# Patient Record
Sex: Female | Born: 1970 | Race: White | Hispanic: No | Marital: Single | Smoking: Never smoker
Health system: Southern US, Community
[De-identification: ages and names within clinical notes are randomized; demographics above are authoritative.]

## PROBLEM LIST (undated history)

## (undated) DIAGNOSIS — R053 Chronic cough: Secondary | ICD-10-CM

---

## 1998-01-08 ENCOUNTER — Emergency Department (HOSPITAL_COMMUNITY): Admission: EM | Admit: 1998-01-08 | Discharge: 1998-01-08 | Payer: Self-pay | Admitting: Emergency Medicine

## 2003-04-13 ENCOUNTER — Encounter: Admission: RE | Admit: 2003-04-13 | Discharge: 2003-04-13 | Payer: Self-pay | Admitting: Family Medicine

## 2010-02-26 ENCOUNTER — Encounter: Payer: Self-pay | Admitting: Family Medicine

## 2020-05-01 ENCOUNTER — Emergency Department (HOSPITAL_COMMUNITY): Payer: Self-pay

## 2020-05-01 ENCOUNTER — Encounter (HOSPITAL_COMMUNITY): Payer: Self-pay | Admitting: Emergency Medicine

## 2020-05-01 ENCOUNTER — Inpatient Hospital Stay (HOSPITAL_COMMUNITY)
Admission: EM | Admit: 2020-05-01 | Discharge: 2020-05-04 | DRG: 854 | Disposition: A | Discharge status: Home / Self Care | Payer: Self-pay | Attending: Family Medicine | Admitting: Family Medicine

## 2020-05-01 DIAGNOSIS — Z91012 Allergy to eggs: Secondary | ICD-10-CM

## 2020-05-01 DIAGNOSIS — Z20822 Contact with and (suspected) exposure to covid-19: Secondary | ICD-10-CM | POA: Diagnosis present

## 2020-05-01 DIAGNOSIS — Z91018 Allergy to other foods: Secondary | ICD-10-CM

## 2020-05-01 DIAGNOSIS — E876 Hypokalemia: Secondary | ICD-10-CM | POA: Diagnosis present

## 2020-05-01 DIAGNOSIS — E872 Acidosis, unspecified: Secondary | ICD-10-CM | POA: Diagnosis present

## 2020-05-01 DIAGNOSIS — N289 Disorder of kidney and ureter, unspecified: Secondary | ICD-10-CM

## 2020-05-01 DIAGNOSIS — Z833 Family history of diabetes mellitus: Secondary | ICD-10-CM

## 2020-05-01 DIAGNOSIS — Z23 Encounter for immunization: Secondary | ICD-10-CM

## 2020-05-01 DIAGNOSIS — L02212 Cutaneous abscess of back [any part, except buttock]: Secondary | ICD-10-CM | POA: Diagnosis present

## 2020-05-01 DIAGNOSIS — A419 Sepsis, unspecified organism: Principal | ICD-10-CM | POA: Diagnosis present

## 2020-05-01 DIAGNOSIS — L0291 Cutaneous abscess, unspecified: Secondary | ICD-10-CM

## 2020-05-01 DIAGNOSIS — I96 Gangrene, not elsewhere classified: Secondary | ICD-10-CM | POA: Diagnosis present

## 2020-05-01 DIAGNOSIS — B9562 Methicillin resistant Staphylococcus aureus infection as the cause of diseases classified elsewhere: Secondary | ICD-10-CM | POA: Diagnosis present

## 2020-05-01 DIAGNOSIS — R238 Other skin changes: Secondary | ICD-10-CM | POA: Diagnosis present

## 2020-05-01 HISTORY — DX: Chronic cough: R05.3

## 2020-05-01 LAB — URINALYSIS, ROUTINE W REFLEX MICROSCOPIC
Bilirubin Urine: NEGATIVE
Glucose, UA: NEGATIVE mg/dL
Hgb urine dipstick: NEGATIVE
Ketones, ur: NEGATIVE mg/dL
Nitrite: NEGATIVE
Protein, ur: NEGATIVE mg/dL
Specific Gravity, Urine: 1.018 (ref 1.005–1.030)
pH: 5 (ref 5.0–8.0)

## 2020-05-01 LAB — CBC WITH DIFFERENTIAL/PLATELET
Abs Immature Granulocytes: 0.2 10*3/uL — ABNORMAL HIGH (ref 0.00–0.07)
Basophils Absolute: 0 10*3/uL (ref 0.0–0.1)
Basophils Relative: 0 %
Eosinophils Absolute: 0.4 10*3/uL (ref 0.0–0.5)
Eosinophils Relative: 2 %
HCT: 37.4 % (ref 36.0–46.0)
Hemoglobin: 12.2 g/dL (ref 12.0–15.0)
Immature Granulocytes: 1 %
Lymphocytes Relative: 13 %
Lymphs Abs: 2.7 10*3/uL (ref 0.7–4.0)
MCH: 30.8 pg (ref 26.0–34.0)
MCHC: 32.6 g/dL (ref 30.0–36.0)
MCV: 94.4 fL (ref 80.0–100.0)
Monocytes Absolute: 0.9 10*3/uL (ref 0.1–1.0)
Monocytes Relative: 4 %
Neutro Abs: 16.8 10*3/uL — ABNORMAL HIGH (ref 1.7–7.7)
Neutrophils Relative %: 80 %
Platelets: 479 10*3/uL — ABNORMAL HIGH (ref 150–400)
RBC: 3.96 MIL/uL (ref 3.87–5.11)
RDW: 12.5 % (ref 11.5–15.5)
WBC: 21 10*3/uL — ABNORMAL HIGH (ref 4.0–10.5)
nRBC: 0 % (ref 0.0–0.2)

## 2020-05-01 LAB — COMPREHENSIVE METABOLIC PANEL
ALT: 41 U/L (ref 0–44)
AST: 30 U/L (ref 15–41)
Albumin: 2.7 g/dL — ABNORMAL LOW (ref 3.5–5.0)
Alkaline Phosphatase: 145 U/L — ABNORMAL HIGH (ref 38–126)
Anion gap: 11 (ref 5–15)
BUN: 10 mg/dL (ref 6–20)
CO2: 26 mmol/L (ref 22–32)
Calcium: 8 mg/dL — ABNORMAL LOW (ref 8.9–10.3)
Chloride: 101 mmol/L (ref 98–111)
Creatinine, Ser: 0.83 mg/dL (ref 0.44–1.00)
GFR, Estimated: >60 mL/min (ref >60)
Glucose, Bld: 149 mg/dL — ABNORMAL HIGH (ref 70–99)
Potassium: 3.2 mmol/L — ABNORMAL LOW (ref 3.5–5.1)
Sodium: 138 mmol/L (ref 135–145)
Total Bilirubin: 0.6 mg/dL (ref 0.3–1.2)
Total Protein: 6.1 g/dL — ABNORMAL LOW (ref 6.5–8.1)

## 2020-05-01 LAB — LACTIC ACID, PLASMA: Lactic Acid, Venous: 2 mmol/L (ref 0.5–1.9)

## 2020-05-01 LAB — I-STAT BETA HCG BLOOD, ED (MC, WL, AP ONLY): I-stat hCG, quantitative: <5 m[IU]/mL (ref <5)

## 2020-05-01 MED ORDER — PIPERACILLIN-TAZOBACTAM 3.375 G IVPB 30 MIN
3.3750 g | Freq: Once | INTRAVENOUS | Status: AC
  Administered 2020-05-02: 3.375 g via INTRAVENOUS
  Filled 2020-05-01: qty 50

## 2020-05-01 MED ORDER — LACTATED RINGERS IV BOLUS (SEPSIS)
1000.0000 mL | Freq: Once | INTRAVENOUS | Status: AC
  Administered 2020-05-02: 1000 mL via INTRAVENOUS

## 2020-05-01 MED ORDER — FENTANYL CITRATE (PF) 100 MCG/2ML IJ SOLN
50.0000 ug | Freq: Once | INTRAMUSCULAR | Status: AC
  Administered 2020-05-02: 50 ug via INTRAVENOUS
  Filled 2020-05-01: qty 2

## 2020-05-01 MED ORDER — LACTATED RINGERS IV SOLN: INTRAVENOUS | Status: DC

## 2020-05-01 MED ORDER — VANCOMYCIN HCL IN DEXTROSE 1-5 GM/200ML-% IV SOLN
1000.0000 mg | Freq: Once | INTRAVENOUS | Status: AC
  Administered 2020-05-02: 1000 mg via INTRAVENOUS
  Filled 2020-05-01 (×2): qty 200

## 2020-05-01 MED ORDER — ACETAMINOPHEN 325 MG PO TABS
650.0000 mg | ORAL_TABLET | Freq: Once | ORAL | Status: AC
  Administered 2020-05-02: 650 mg via ORAL
  Filled 2020-05-01: qty 2

## 2020-05-01 NOTE — ED Notes (Signed)
The pt has three complaints swollen lesion lt posterior chest brown crusted area she has had for over a week does not know what started it  Coughing for over a week and itching over her body

## 2020-05-01 NOTE — Sepsis Progress Note (Signed)
Following for Code Sepsis  

## 2020-05-01 NOTE — ED Provider Notes (Signed)
[place] EMERGENCY DEPARTMENT Provider Note   CSN: [id number] Arrival date & time: 05/01/20  1830     History Chief Complaint  Patient presents with  . Abscess  . Rash    Robin Padilla is a 50 y.o. female without significant past medical hx who presents to the ED with complaints of painful wound to right upper back x 1.5 weeks. Patient states that she has a painful wound to her right upper back, it has been constant, states it came out of nowhere and has been persistent, discomfort is worse when laying on her back or applying pressure to that area. No other alleviating/aggravating factors. Has had some drainage from the wound on her clothing. With this painful wound area she has also developed a rash primarily to her upper/lower extremities, some to the abdomen, which is pruritic, includes the palms of the hands. She does not note any new products, environments, or medications. Denies recent foreign travel, prior hx of TB, close contact w/ TB, or prior incarceration. She is not sexually active and does not have concern for STI. Denies IVDU. Denies tic bites. No sick contacts w/ similar sxs. Her father has noted that she had been very warm to the touch, no known fevers at home. Patient also mentions chronic cough/throat clearing problem. Denies chest pain, dyspnea, abdominal pain, N/V/D, leg swelling, or syncope.    HPI     History reviewed. No pertinent past medical history.  There are no problems to display for this patient.   History reviewed. No pertinent surgical history.   OB History   No obstetric history on file.     No family history on file.  Social History   Tobacco Use  . Smoking status: Never Smoker  . Smokeless tobacco: Never Used  Substance Use Topics  . Alcohol use: Not Currently  . Drug use: Not Currently    Home Medications Prior to Admission medications   Not on File    Allergies    Patient has no allergy information on  record.  Review of Systems   Review of Systems  Constitutional: Negative for fever.  HENT: Positive for congestion. Negative for ear pain and sore throat.   Respiratory: Positive for cough. Negative for shortness of breath.   Cardiovascular: Negative for chest pain.  Gastrointestinal: Negative for abdominal pain, diarrhea, nausea and vomiting.  Genitourinary: Negative for dysuria.  Musculoskeletal: Positive for back pain (to wound).  Skin: Positive for rash and wound.  Neurological: Negative for syncope.  All other systems reviewed and are negative.   Physical Exam Updated Vital Signs BP 138/84 (BP Location: Left Arm)   Pulse (!) 124   Temp 99.8 F (37.7 C) (Oral)   Resp 16   SpO2 100%   Physical Exam Vitals and nursing note reviewed.  Constitutional:      General: She is not in acute distress.    Appearance: She is well-developed. She is not toxic-appearing.  HENT:     Head: Normocephalic and atraumatic.  Eyes:     General:        Right eye: No discharge.        Left eye: No discharge.     Conjunctiva/sclera: Conjunctivae normal.  Cardiovascular:     Rate and Rhythm: Regular rhythm. Tachycardia present.  Pulmonary:     Effort: Pulmonary effort is normal. No respiratory distress.     Breath sounds: Normal breath sounds. No wheezing, rhonchi or rales.  Abdominal:  General: There is no distension.     Palpations: Abdomen is soft.     Tenderness: There is no abdominal tenderness. There is no guarding or rebound.  Musculoskeletal:     Cervical back: Neck supple.  Skin:    General: Skin is warm and dry.     Findings: Erythema and rash (rash primarily to the lower extremities and some to the upper extremities bilaterally, mild areas somewhat noted on the palms) present. Rash is macular and papular.       Neurological:     Mental Status: She is alert.     Comments: Clear speech.   Psychiatric:        Behavior: Behavior normal.                 ED  Results / Procedures / Treatments   Labs (all labs ordered are listed, but only abnormal results are displayed) Labs Reviewed  LACTIC ACID, PLASMA - Abnormal; Notable for the following components:      Result Value   Lactic Acid, Venous 2.0 (*)    All other components within normal limits  COMPREHENSIVE METABOLIC PANEL - Abnormal; Notable for the following components:   Potassium 3.2 (*)    Glucose, Bld 149 (*)    Calcium 8.0 (*)    Total Protein 6.1 (*)    Albumin 2.7 (*)    Alkaline Phosphatase 145 (*)    All other components within normal limits  CBC WITH DIFFERENTIAL/PLATELET - Abnormal; Notable for the following components:   WBC 21.0 (*)    Platelets 479 (*)    Neutro Abs 16.8 (*)    Abs Immature Granulocytes 0.20 (*)    All other components within normal limits  LACTIC ACID, PLASMA  URINALYSIS, ROUTINE W REFLEX MICROSCOPIC  I-STAT BETA HCG BLOOD, ED ([facility], [facility], [facility] ONLY)    EKG None  Radiology CT Chest W Contrast  Result Date: 05/02/2020 CLINICAL DATA:  Left posterior chest wall mass EXAM: CT CHEST WITH CONTRAST TECHNIQUE: Multidetector CT imaging of the chest was performed during intravenous contrast administration. CONTRAST:  22m OMNIPAQUE IOHEXOL 300 MG/ML  SOLN COMPARISON:  Abdominal ultrasound April 13, 2003. FINDINGS: Cardiovascular: No significant vascular findings. Normal heart size. No pericardial effusion. Mediastinum/Nodes: No enlarged mediastinal, hilar, or axillary lymph nodes. Thyroid gland, trachea, and esophagus demonstrate no significant findings. Lungs/Pleura: Lungs are clear. No pleural effusion or pneumothorax. Upper Abdomen: Multiple hypodense left upper pole renal lesions some of which are only partially visualized largest of which measures 2.3 cm on image 136/3 with density greater than that which would be expected for simple cyst. Musculoskeletal: Phlegmonous collection with adjacent inflammatory stranding and cutaneous skin thickening in the subcutaneous  soft tissues of the left posterior thorax measuring 5.7 x 2.6 x 7.4 cm on image 34/7 and 87/3. Consistently stool IMPRESSION: 1. Phlegmonous collection with adjacent inflammatory stranding and cutaneous skin thickening in the subcutaneous soft tissues of the left posterior thorax measuring 5.7 x 2.6 x 7.4 cm. Findings are most consistent with a soft tissue phlegmonous collection/developing abscess. 2. Multiple hypodense left upper pole renal lesions some of which are only partially visualized largest of which measures 2.3 cm with density greater than that would be expected for simple cyst. Further evaluation with nonemergent renal protocol abdominal MRI or CT with and without contrast in is recommended. Electronically Signed   By: JDahlia BailiffMD   On: 05/02/2020 01:05    Procedures .Critical Care E&M Performed by: PKennith Maes  R, PA-C  After initial E/M assessment, critical care services were subsequently performed that were exclusive of separately billable procedures or treatment.    Marland Kitchen.Incision and Drainage  Date/Time: 05/02/2020 2:44 AM Performed by: Amaryllis Dyke, PA-C Authorized by: Amaryllis Dyke, PA-C   Consent:    Consent obtained:  Verbal   Consent given by:  Patient   Risks, benefits, and alternatives were discussed: yes     Risks discussed:  Bleeding, damage to other organs, infection, incomplete drainage and pain   Alternatives discussed:  No treatment and alternative treatment Location:    Type:  Abscess   Size:  7.4   Location:  Trunk   Trunk location:  Back Pre-procedure details:    Skin preparation:  Povidone-iodine Anesthesia:    Anesthesia method:  Local infiltration   Local anesthetic:  Lidocaine 1% WITH epi Procedure type:    Complexity:  Complex Procedure details:    Needle aspiration: yes     Needle size:  18 G   Incision types:  Stab incision   Wound management:  Probed and deloculated and irrigated with saline   Drainage:   Bloody and purulent   Drainage amount:  Moderate   Wound treatment:  Wound left open   Packing materials:  1/4 in iodoform gauze Post-procedure details:    Procedure completion:  Tolerated well, no immediate complications  CRITICAL CARE Performed by: Kennith Maes   Total critical care time: 35 minutes  Critical care time was exclusive of separately billable procedures and treating other patients.  Critical care was necessary to treat or prevent imminent or life-threatening deterioration.  Critical care was time spent personally by me on the following activities: development of treatment plan with patient and/or surrogate as well as nursing, discussions with consultants, evaluation of patient's response to treatment, examination of patient, obtaining history from patient or surrogate, ordering and performing treatments and interventions, ordering and review of laboratory studies, ordering and review of radiographic studies, pulse oximetry and re-evaluation of patient's condition.    Medications Ordered in ED Medications  lactated ringers infusion (has no administration in time range)  lactated ringers bolus 1,000 mL (1,000 mLs Intravenous New Bag/Given 05/02/20 0217)  vancomycin (VANCOCIN) IVPB 1000 mg/200 mL premix (1,000 mg Intravenous New Bag/Given 05/02/20 0209)  piperacillin-tazobactam (ZOSYN) IVPB 3.375 g (has no administration in time range)  vancomycin (VANCOREADY) IVPB 1250 mg/250 mL (has no administration in time range)  acetaminophen (TYLENOL) tablet 650 mg (650 mg Oral Given 05/02/20 0235)  fentaNYL (SUBLIMAZE) injection 50 mcg (50 mcg Intravenous Given 05/02/20 0219)  piperacillin-tazobactam (ZOSYN) IVPB 3.375 g (0 g Intravenous Stopped 05/02/20 0049)  lactated ringers bolus 1,000 mL (0 mLs Intravenous Stopped 05/02/20 0222)  iohexol (OMNIPAQUE) 300 MG/ML solution 75 mL (75 mLs Intravenous Contrast Given 05/02/20 0047)  lidocaine-EPINEPHrine (XYLOCAINE W/EPI) 1 %-1:100000  (with pres) injection 20 mL (20 mLs Infiltration Given 05/02/20 0222)  Tdap (BOOSTRIX) injection 0.5 mL (0.5 mLs Intramuscular Given 05/02/20 0236)    ED Course  I have reviewed the triage vital signs and the nursing notes.  Pertinent labs & imaging results that were available during my care of the patient were reviewed by me and considered in my medical decision making (see chart for details).    MDM Rules/Calculators/A&P                         Patient presents to the ED with complaints of left upper back wound & rash.  Nontoxic, tachycardic, borderline oral temp of 99.   Additional history obtained:  Additional history obtained from chart review & nursing note review.   Lab Tests:  I reviewed and interpreted labs by triage, which included:  CBC: Leukocytosis @ 21 with left shift. Elevated platelets.  CMP: mild hypoalbuminemia & hypokalemia. Mild elevation in alk phos.  Lactic acid: mild elevation @ 2.0.   Following initial assessment code sepsis activated, vanc/zosyn ordered, 1L LR started, will also give tylenol/fentanyl for pain. Will further assess with additional laboratory testing & CT chest with contrast. Plan for admission. Findings & plan of care discussed w/ attending Dr. Langston Masker who has evaluated patient & is in agreement.   Additionally ordered, reviewed & interpreted additional laboratory testing:  PT/INR/APTT: WNL COVID/influenza: Negative HIV: Non-reactive RPR: Pending UDS: Pending  Imaging Studies ordered:  I ordered imaging studies which included CT chest w contrast, I independently reviewed, formal radiology impression shows:  1. Phlegmonous collection with adjacent inflammatory stranding and cutaneous skin thickening in the subcutaneous soft tissues of the left posterior thorax measuring 5.7 x 2.6 x 7.4 cm. Findings are most consistent with a soft tissue phlegmonous collection/developing abscess. 2. Multiple hypodense left upper pole renal lesions some of which are  only partially visualized largest of which measures 2.3 cm with density greater than that would be expected for simple cyst. Further evaluation with nonemergent renal protocol abdominal MRI or CT with and without contrast in is recommended.   ED Course:  01:33: CONSULT: Discussed with hospitalist Dr. Cyd Silence- accepts admission, discussed I&D, I discussed w/ supervising EDP Dr. Roxanne Mins @ change of shift who is in agreement with my proceeding with I&D in the ED.   I&D performed as documented above, patient tolerated well.  Given extent of abscess and appearance of tissue feel she would benefit from general surgery reassessment during admission which was relayed to admitting team.   Portions of this note were generated with Dragon dictation software. Dictation errors may occur despite best attempts at proofreading.  Final Clinical Impression(s) / ED Diagnoses Final diagnoses:  Abscess  Sepsis, due to unspecified organism, unspecified whether acute organ dysfunction present [facility])    Rx / DC Orders ED Discharge Orders    None       Amaryllis Dyke, PA-C 05/02/20 0252    Wyvonnia Dusky, MD 05/02/20 1043

## 2020-05-01 NOTE — Sepsis Progress Note (Signed)
Notified bedside nurse of need to order antibiotics   Antibiotics now ordered

## 2020-05-01 NOTE — ED Triage Notes (Addendum)
Large abscess to L upper back that pt reports has been there for 1 week.  Also reports rash all over x 5-6 days.  Pt reports cough that she states is "a respiratory thing I've had 3 years since walking in the water when my mom died."  Pt denies previous medical history.

## 2020-05-02 ENCOUNTER — Observation Stay (HOSPITAL_COMMUNITY): Payer: Self-pay | Admitting: Certified Registered"

## 2020-05-02 ENCOUNTER — Other Ambulatory Visit: Payer: Self-pay

## 2020-05-02 ENCOUNTER — Observation Stay (HOSPITAL_COMMUNITY): Payer: Self-pay

## 2020-05-02 ENCOUNTER — Emergency Department (HOSPITAL_COMMUNITY): Payer: Self-pay

## 2020-05-02 ENCOUNTER — Encounter (HOSPITAL_COMMUNITY)
Admission: EM | Disposition: A | Discharge status: Home / Self Care | Payer: Self-pay | Source: Home / Self Care | Attending: Family Medicine

## 2020-05-02 ENCOUNTER — Encounter (HOSPITAL_COMMUNITY): Payer: Self-pay | Admitting: Internal Medicine

## 2020-05-02 DIAGNOSIS — L02212 Cutaneous abscess of back [any part, except buttock]: Secondary | ICD-10-CM

## 2020-05-02 DIAGNOSIS — E876 Hypokalemia: Secondary | ICD-10-CM | POA: Diagnosis present

## 2020-05-02 DIAGNOSIS — E872 Acidosis, unspecified: Secondary | ICD-10-CM | POA: Diagnosis present

## 2020-05-02 DIAGNOSIS — N289 Disorder of kidney and ureter, unspecified: Secondary | ICD-10-CM

## 2020-05-02 DIAGNOSIS — A419 Sepsis, unspecified organism: Secondary | ICD-10-CM | POA: Diagnosis present

## 2020-05-02 DIAGNOSIS — L0291 Cutaneous abscess, unspecified: Secondary | ICD-10-CM

## 2020-05-02 HISTORY — PX: OTHER SURGICAL HISTORY: SHX169

## 2020-05-02 HISTORY — PX: INCISION AND DRAINAGE OF WOUND: SHX1803

## 2020-05-02 HISTORY — DX: Cutaneous abscess, unspecified: L02.91

## 2020-05-02 LAB — COMPREHENSIVE METABOLIC PANEL
ALT: 30 U/L (ref 0–44)
AST: 22 U/L (ref 15–41)
Albumin: 2.2 g/dL — ABNORMAL LOW (ref 3.5–5.0)
Alkaline Phosphatase: 103 U/L (ref 38–126)
Anion gap: 7 (ref 5–15)
BUN: 5 mg/dL — ABNORMAL LOW (ref 6–20)
CO2: 29 mmol/L (ref 22–32)
Calcium: 7.9 mg/dL — ABNORMAL LOW (ref 8.9–10.3)
Chloride: 103 mmol/L (ref 98–111)
Creatinine, Ser: 0.7 mg/dL (ref 0.44–1.00)
GFR, Estimated: >60 mL/min (ref >60)
Glucose, Bld: 137 mg/dL — ABNORMAL HIGH (ref 70–99)
Potassium: 3.4 mmol/L — ABNORMAL LOW (ref 3.5–5.1)
Sodium: 139 mmol/L (ref 135–145)
Total Bilirubin: 1 mg/dL (ref 0.3–1.2)
Total Protein: 4.8 g/dL — ABNORMAL LOW (ref 6.5–8.1)

## 2020-05-02 LAB — POCT I-STAT EG7
Acid-Base Excess: 6 mmol/L — ABNORMAL HIGH (ref 0.0–2.0)
Bicarbonate: 31 mmol/L — ABNORMAL HIGH (ref 20.0–28.0)
Calcium, Ion: 1.13 mmol/L — ABNORMAL LOW (ref 1.15–1.40)
HCT: 31 % — ABNORMAL LOW (ref 36.0–46.0)
Hemoglobin: 10.5 g/dL — ABNORMAL LOW (ref 12.0–15.0)
O2 Saturation: 85 %
Potassium: 3.1 mmol/L — ABNORMAL LOW (ref 3.5–5.1)
Sodium: 139 mmol/L (ref 135–145)
TCO2: 32 mmol/L (ref 22–32)
pCO2, Ven: 48 mmHg (ref 44.0–60.0)
pH, Ven: 7.418 (ref 7.250–7.430)
pO2, Ven: 50 mmHg — ABNORMAL HIGH (ref 32.0–45.0)

## 2020-05-02 LAB — CBC WITH DIFFERENTIAL/PLATELET
Abs Immature Granulocytes: 0.17 10*3/uL — ABNORMAL HIGH (ref 0.00–0.07)
Basophils Absolute: 0 10*3/uL (ref 0.0–0.1)
Basophils Relative: 0 %
Eosinophils Absolute: 0.3 10*3/uL (ref 0.0–0.5)
Eosinophils Relative: 1 %
HCT: 32.9 % — ABNORMAL LOW (ref 36.0–46.0)
Hemoglobin: 10.5 g/dL — ABNORMAL LOW (ref 12.0–15.0)
Immature Granulocytes: 1 %
Lymphocytes Relative: 7 %
Lymphs Abs: 1.7 10*3/uL (ref 0.7–4.0)
MCH: 31 pg (ref 26.0–34.0)
MCHC: 31.9 g/dL (ref 30.0–36.0)
MCV: 97.1 fL (ref 80.0–100.0)
Monocytes Absolute: 1 10*3/uL (ref 0.1–1.0)
Monocytes Relative: 4 %
Neutro Abs: 19.4 10*3/uL — ABNORMAL HIGH (ref 1.7–7.7)
Neutrophils Relative %: 87 %
Platelets: 428 10*3/uL — ABNORMAL HIGH (ref 150–400)
RBC: 3.39 MIL/uL — ABNORMAL LOW (ref 3.87–5.11)
RDW: 12.8 % (ref 11.5–15.5)
WBC: 22.5 10*3/uL — ABNORMAL HIGH (ref 4.0–10.5)
nRBC: 0 % (ref 0.0–0.2)

## 2020-05-02 LAB — HIV ANTIBODY (ROUTINE TESTING W REFLEX): HIV Screen 4th Generation wRfx: NONREACTIVE

## 2020-05-02 LAB — RPR: RPR Ser Ql: NONREACTIVE

## 2020-05-02 LAB — LACTIC ACID, PLASMA
Lactic Acid, Venous: 1.1 mmol/L (ref 0.5–1.9)
Lactic Acid, Venous: 2.6 mmol/L (ref 0.5–1.9)

## 2020-05-02 LAB — HEMOGLOBIN A1C
Hgb A1c MFr Bld: 5.8 % — ABNORMAL HIGH (ref 4.8–5.6)
Mean Plasma Glucose: 119.76 mg/dL

## 2020-05-02 LAB — APTT: aPTT: 36 seconds (ref 24–36)

## 2020-05-02 LAB — PROTIME-INR
INR: 1 (ref 0.8–1.2)
INR: 1.1 (ref 0.8–1.2)
Prothrombin Time: 13.1 seconds (ref 11.4–15.2)
Prothrombin Time: 14.1 seconds (ref 11.4–15.2)

## 2020-05-02 LAB — GLUCOSE, CAPILLARY: Glucose-Capillary: 117 mg/dL — ABNORMAL HIGH (ref 70–99)

## 2020-05-02 LAB — MAGNESIUM: Magnesium: 0.5 mg/dL — CL (ref 1.7–2.4)

## 2020-05-02 LAB — PROCALCITONIN: Procalcitonin: 0.28 ng/mL

## 2020-05-02 LAB — RESP PANEL BY RT-PCR (FLU A&B, COVID) ARPGX2
Influenza A by PCR: NEGATIVE
Influenza B by PCR: NEGATIVE
SARS Coronavirus 2 by RT PCR: NEGATIVE

## 2020-05-02 LAB — CORTISOL-AM, BLOOD: Cortisol - AM: 12 ug/dL (ref 6.7–22.6)

## 2020-05-02 SURGERY — IRRIGATION AND DEBRIDEMENT WOUND
Anesthesia: General | Laterality: Left

## 2020-05-02 MED ORDER — ALBUMIN HUMAN 5 % IV SOLN: INTRAVENOUS | Status: DC | PRN

## 2020-05-02 MED ORDER — MORPHINE SULFATE (PF) 4 MG/ML IV SOLN: 4.0000 mg | INTRAVENOUS | Status: DC | PRN

## 2020-05-02 MED ORDER — TETANUS-DIPHTH-ACELL PERTUSSIS 5-2.5-18.5 LF-MCG/0.5 IM SUSY
0.5000 mL | PREFILLED_SYRINGE | Freq: Once | INTRAMUSCULAR | Status: AC
  Administered 2020-05-02: 0.5 mL via INTRAMUSCULAR
  Filled 2020-05-02: qty 0.5

## 2020-05-02 MED ORDER — DOCUSATE SODIUM 100 MG PO CAPS
100.0000 mg | ORAL_CAPSULE | Freq: Two times a day (BID) | ORAL | Status: DC
  Administered 2020-05-02 – 2020-05-04 (×4): 100 mg via ORAL
  Filled 2020-05-02 (×4): qty 1

## 2020-05-02 MED ORDER — ONDANSETRON HCL 4 MG/2ML IJ SOLN
4.0000 mg | Freq: Four times a day (QID) | INTRAMUSCULAR | Status: DC | PRN
  Administered 2020-05-02 – 2020-05-03 (×2): 4 mg via INTRAVENOUS
  Filled 2020-05-02 (×3): qty 2

## 2020-05-02 MED ORDER — LACTATED RINGERS IV BOLUS
1000.0000 mL | Freq: Once | INTRAVENOUS | Status: AC
  Administered 2020-05-02: 1000 mL via INTRAVENOUS

## 2020-05-02 MED ORDER — MORPHINE SULFATE (PF) 2 MG/ML IV SOLN
2.0000 mg | INTRAVENOUS | Status: DC | PRN
  Administered 2020-05-03 (×2): 2 mg via INTRAVENOUS
  Filled 2020-05-02 (×3): qty 1

## 2020-05-02 MED ORDER — PROPOFOL 10 MG/ML IV BOLUS
INTRAVENOUS | Status: AC
  Filled 2020-05-02: qty 20

## 2020-05-02 MED ORDER — DEXAMETHASONE SODIUM PHOSPHATE 10 MG/ML IJ SOLN
INTRAMUSCULAR | Status: DC | PRN
  Administered 2020-05-02: 4 mg via INTRAVENOUS

## 2020-05-02 MED ORDER — ROCURONIUM BROMIDE 10 MG/ML (PF) SYRINGE
PREFILLED_SYRINGE | INTRAVENOUS | Status: DC | PRN
  Administered 2020-05-02: 50 mg via INTRAVENOUS

## 2020-05-02 MED ORDER — ACETAMINOPHEN 325 MG PO TABS
650.0000 mg | ORAL_TABLET | Freq: Four times a day (QID) | ORAL | Status: DC | PRN
  Administered 2020-05-02: 650 mg via ORAL
  Filled 2020-05-02: qty 2

## 2020-05-02 MED ORDER — ONDANSETRON HCL 4 MG/2ML IJ SOLN
INTRAMUSCULAR | Status: DC | PRN
  Administered 2020-05-02: 4 mg via INTRAVENOUS

## 2020-05-02 MED ORDER — PIPERACILLIN-TAZOBACTAM 3.375 G IVPB: 3.3750 g | Freq: Three times a day (TID) | INTRAVENOUS | Status: DC

## 2020-05-02 MED ORDER — LIDOCAINE 2% (20 MG/ML) 5 ML SYRINGE
INTRAMUSCULAR | Status: DC | PRN
  Administered 2020-05-02: 20 mg via INTRAVENOUS

## 2020-05-02 MED ORDER — HYDROMORPHONE HCL 1 MG/ML IJ SOLN
INTRAMUSCULAR | Status: AC
  Filled 2020-05-02: qty 1

## 2020-05-02 MED ORDER — POLYETHYLENE GLYCOL 3350 17 G PO PACK: 17.0000 g | PACK | Freq: Every day | ORAL | Status: DC | PRN

## 2020-05-02 MED ORDER — PROMETHAZINE HCL 25 MG/ML IJ SOLN: 6.2500 mg | INTRAMUSCULAR | Status: DC | PRN

## 2020-05-02 MED ORDER — OXYCODONE HCL 5 MG PO TABS: 5.0000 mg | ORAL_TABLET | ORAL | Status: DC | PRN

## 2020-05-02 MED ORDER — PROPOFOL 10 MG/ML IV BOLUS
INTRAVENOUS | Status: DC | PRN
  Administered 2020-05-02: 120 mg via INTRAVENOUS

## 2020-05-02 MED ORDER — MIDAZOLAM HCL 2 MG/2ML IJ SOLN
INTRAMUSCULAR | Status: AC
  Filled 2020-05-02: qty 2

## 2020-05-02 MED ORDER — IOHEXOL 300 MG/ML  SOLN
75.0000 mL | Freq: Once | INTRAMUSCULAR | Status: AC | PRN
  Administered 2020-05-02: 75 mL via INTRAVENOUS

## 2020-05-02 MED ORDER — CHLORHEXIDINE GLUCONATE 0.12 % MT SOLN
OROMUCOSAL | Status: AC
  Administered 2020-05-02: 15 mL via OROMUCOSAL
  Filled 2020-05-02: qty 15

## 2020-05-02 MED ORDER — PHENYLEPHRINE 40 MCG/ML (10ML) SYRINGE FOR IV PUSH (FOR BLOOD PRESSURE SUPPORT)
PREFILLED_SYRINGE | INTRAVENOUS | Status: DC | PRN
  Administered 2020-05-02 (×2): 120 ug via INTRAVENOUS

## 2020-05-02 MED ORDER — SODIUM CHLORIDE 0.9 % IV SOLN
2.0000 g | Freq: Three times a day (TID) | INTRAVENOUS | Status: DC
  Administered 2020-05-02 – 2020-05-03 (×5): 2 g via INTRAVENOUS
  Filled 2020-05-02 (×6): qty 2

## 2020-05-02 MED ORDER — SUGAMMADEX SODIUM 200 MG/2ML IV SOLN
INTRAVENOUS | Status: DC | PRN
  Administered 2020-05-02: 200 mg via INTRAVENOUS

## 2020-05-02 MED ORDER — POTASSIUM CHLORIDE 20 MEQ PO PACK
40.0000 meq | PACK | Freq: Once | ORAL | Status: DC
  Filled 2020-05-02: qty 2

## 2020-05-02 MED ORDER — HYDROMORPHONE HCL 1 MG/ML IJ SOLN
0.2500 mg | INTRAMUSCULAR | Status: DC | PRN
  Administered 2020-05-02 (×2): 0.25 mg via INTRAVENOUS
  Administered 2020-05-02: 0.5 mg via INTRAVENOUS

## 2020-05-02 MED ORDER — ACETAMINOPHEN 500 MG PO TABS
1000.0000 mg | ORAL_TABLET | Freq: Four times a day (QID) | ORAL | Status: DC
  Administered 2020-05-02 – 2020-05-04 (×6): 1000 mg via ORAL
  Filled 2020-05-02 (×6): qty 2

## 2020-05-02 MED ORDER — VANCOMYCIN HCL 1.25 G IV SOLR: 1250.0000 mg | INTRAVENOUS | Status: DC

## 2020-05-02 MED ORDER — POTASSIUM CHLORIDE CRYS ER 20 MEQ PO TBCR
40.0000 meq | EXTENDED_RELEASE_TABLET | Freq: Once | ORAL | Status: AC
  Administered 2020-05-02: 40 meq via ORAL
  Filled 2020-05-02: qty 2

## 2020-05-02 MED ORDER — GADOBUTROL 1 MMOL/ML IV SOLN
7.0000 mL | Freq: Once | INTRAVENOUS | Status: AC | PRN
  Administered 2020-05-02: 7 mL via INTRAVENOUS

## 2020-05-02 MED ORDER — ENOXAPARIN SODIUM 40 MG/0.4ML ~~LOC~~ SOLN
40.0000 mg | Freq: Every day | SUBCUTANEOUS | Status: DC
  Administered 2020-05-02 – 2020-05-04 (×3): 40 mg via SUBCUTANEOUS
  Filled 2020-05-02 (×3): qty 0.4

## 2020-05-02 MED ORDER — OXYCODONE HCL 5 MG/5ML PO SOLN: 5.0000 mg | Freq: Once | ORAL | Status: DC | PRN

## 2020-05-02 MED ORDER — POTASSIUM CHLORIDE 10 MEQ/100ML IV SOLN
10.0000 meq | INTRAVENOUS | Status: AC
  Filled 2020-05-02: qty 100

## 2020-05-02 MED ORDER — FENTANYL CITRATE (PF) 250 MCG/5ML IJ SOLN
INTRAMUSCULAR | Status: AC
  Filled 2020-05-02: qty 5

## 2020-05-02 MED ORDER — ORAL CARE MOUTH RINSE: 15.0000 mL | Freq: Once | OROMUCOSAL | Status: AC

## 2020-05-02 MED ORDER — POTASSIUM CHLORIDE 10 MEQ/100ML IV SOLN
INTRAVENOUS | Status: AC
  Administered 2020-05-02: 10 meq
  Filled 2020-05-02: qty 100

## 2020-05-02 MED ORDER — LACTATED RINGERS IV SOLN: INTRAVENOUS | Status: AC

## 2020-05-02 MED ORDER — OXYCODONE-ACETAMINOPHEN 5-325 MG PO TABS
1.0000 | ORAL_TABLET | ORAL | Status: DC | PRN
  Administered 2020-05-02: 1 via ORAL
  Filled 2020-05-02: qty 1

## 2020-05-02 MED ORDER — CHLORHEXIDINE GLUCONATE 0.12 % MT SOLN: 15.0000 mL | Freq: Once | OROMUCOSAL | Status: AC

## 2020-05-02 MED ORDER — MAGNESIUM SULFATE 4 GM/100ML IV SOLN
4.0000 g | Freq: Once | INTRAVENOUS | Status: DC
  Filled 2020-05-02: qty 100

## 2020-05-02 MED ORDER — 0.9 % SODIUM CHLORIDE (POUR BTL) OPTIME
TOPICAL | Status: DC | PRN
  Administered 2020-05-02: 1000 mL

## 2020-05-02 MED ORDER — SODIUM CHLORIDE 0.9 % IV SOLN: INTRAVENOUS | Status: DC

## 2020-05-02 MED ORDER — MIDAZOLAM HCL 5 MG/5ML IJ SOLN
INTRAMUSCULAR | Status: DC | PRN
  Administered 2020-05-02: 2 mg via INTRAVENOUS

## 2020-05-02 MED ORDER — VANCOMYCIN HCL 1250 MG/250ML IV SOLN
1250.0000 mg | INTRAVENOUS | Status: DC
  Administered 2020-05-02 – 2020-05-03 (×2): 1250 mg via INTRAVENOUS
  Filled 2020-05-02 (×3): qty 250

## 2020-05-02 MED ORDER — MEPERIDINE HCL 25 MG/ML IJ SOLN: 6.2500 mg | INTRAMUSCULAR | Status: DC | PRN

## 2020-05-02 MED ORDER — OXYCODONE HCL 5 MG PO TABS: 5.0000 mg | ORAL_TABLET | Freq: Once | ORAL | Status: DC | PRN

## 2020-05-02 MED ORDER — LIDOCAINE-EPINEPHRINE 1 %-1:100000 IJ SOLN
20.0000 mL | Freq: Once | INTRAMUSCULAR | Status: AC
  Administered 2020-05-02: 20 mL
  Filled 2020-05-02: qty 1

## 2020-05-02 MED ORDER — ACETAMINOPHEN 650 MG RE SUPP: 650.0000 mg | Freq: Four times a day (QID) | RECTAL | Status: DC | PRN

## 2020-05-02 MED ORDER — POTASSIUM CHLORIDE 10 MEQ/100ML IV SOLN
INTRAVENOUS | Status: DC | PRN
  Administered 2020-05-02: 10 meq via INTRAVENOUS

## 2020-05-02 MED ORDER — MIDAZOLAM HCL 2 MG/2ML IJ SOLN: 0.5000 mg | Freq: Once | INTRAMUSCULAR | Status: DC | PRN

## 2020-05-02 MED ORDER — ONDANSETRON HCL 4 MG PO TABS: 4.0000 mg | ORAL_TABLET | Freq: Four times a day (QID) | ORAL | Status: DC | PRN

## 2020-05-02 MED ORDER — HEMOSTATIC AGENTS (NO CHARGE) OPTIME
TOPICAL | Status: DC | PRN
Start: 2020-05-02 — End: 2020-05-02
  Administered 2020-05-02: 1

## 2020-05-02 MED ORDER — FENTANYL CITRATE (PF) 250 MCG/5ML IJ SOLN
INTRAMUSCULAR | Status: DC | PRN
  Administered 2020-05-02 (×3): 50 ug via INTRAVENOUS

## 2020-05-02 MED ORDER — SODIUM CHLORIDE 0.9 % IR SOLN
Status: DC | PRN
  Administered 2020-05-02: 3000 mL

## 2020-05-02 SURGICAL SUPPLY — 32 items
BNDG ELASTIC 4X5.8 VLCR STR LF (GAUZE/BANDAGES/DRESSINGS) IMPLANT
BNDG ELASTIC 6X5.8 VLCR STR LF (GAUZE/BANDAGES/DRESSINGS) IMPLANT
BNDG GAUZE ELAST 4 BULKY (GAUZE/BANDAGES/DRESSINGS) IMPLANT
CANISTER SUCT 3000ML PPV (MISCELLANEOUS) ×2 IMPLANT
COVER SURGICAL LIGHT HANDLE (MISCELLANEOUS) ×2 IMPLANT
COVER WAND RF STERILE (DRAPES) ×2 IMPLANT
DRAPE HALF SHEET 40X57 (DRAPES) IMPLANT
DRAPE LAPAROTOMY 100X72 PEDS (DRAPES) IMPLANT
DRSG PAD ABDOMINAL 8X10 ST (GAUZE/BANDAGES/DRESSINGS) ×2 IMPLANT
ELECT REM PT RETURN 9FT ADLT (ELECTROSURGICAL) ×2
ELECTRODE REM PT RTRN 9FT ADLT (ELECTROSURGICAL) ×1 IMPLANT
GAUZE SPONGE 4X4 12PLY STRL (GAUZE/BANDAGES/DRESSINGS) ×2 IMPLANT
GAUZE SPONGE 4X4 12PLY STRL LF (GAUZE/BANDAGES/DRESSINGS) ×2 IMPLANT
GLOVE BIOGEL M STRL SZ7.5 (GLOVE) ×2 IMPLANT
GLOVE INDICATOR 8.0 STRL GRN (GLOVE) ×4 IMPLANT
GOWN STRL REUS W/ TWL LRG LVL3 (GOWN DISPOSABLE) ×1 IMPLANT
GOWN STRL REUS W/TWL 2XL LVL3 (GOWN DISPOSABLE) ×2 IMPLANT
GOWN STRL REUS W/TWL LRG LVL3 (GOWN DISPOSABLE) ×1
HANDPIECE INTERPULSE COAX TIP (DISPOSABLE) ×1
HEMOSTAT SURGICEL 2X14 (HEMOSTASIS) ×2 IMPLANT
KIT BASIN OR (CUSTOM PROCEDURE TRAY) ×2 IMPLANT
KIT TURNOVER KIT B (KITS) ×2 IMPLANT
NS IRRIG 1000ML POUR BTL (IV SOLUTION) IMPLANT
PACK GENERAL/GYN (CUSTOM PROCEDURE TRAY) ×2 IMPLANT
PAD ARMBOARD 7.5X6 YLW CONV (MISCELLANEOUS) ×2 IMPLANT
PENCIL SMOKE EVACUATOR (MISCELLANEOUS) ×2 IMPLANT
SET HNDPC FAN SPRY TIP SCT (DISPOSABLE) ×1 IMPLANT
STOCKINETTE IMPERVIOUS 9X36 MD (GAUZE/BANDAGES/DRESSINGS) IMPLANT
STOCKINETTE IMPERVIOUS LG (DRAPES) IMPLANT
TOWEL GREEN STERILE (TOWEL DISPOSABLE) ×2 IMPLANT
TOWEL GREEN STERILE FF (TOWEL DISPOSABLE) ×2 IMPLANT
UNDERPAD 30X36 HEAVY ABSORB (UNDERPADS AND DIAPERS) ×2 IMPLANT

## 2020-05-02 NOTE — Consult Note (Signed)
WOC Nurse Consult Note: Reason for Consult: Left mid thoracic lesion. Photo is appreciated. WOC Nursing is simultaneously consulted with General Surgery and we will defer to them for assessment and POC as this is an atypical lesion on a complex patient. Pressure Injury POA: N/A  WOC nursing team will not follow, but will remain available to this patient, the nursing and medical teams.  Please re-consult if needed. Thanks, Ladona Mow, MSN, RN, GNP, [facility]  Pager# [phone]

## 2020-05-02 NOTE — Transfer of Care (Signed)
Immediate Anesthesia Transfer of Care Note  Patient: Robin Padilla  Procedure(s) Performed: IRRIGATION AND DEBRIDEMENT BACK  WOUND (Left )  Patient Location: PACU  Anesthesia Type:General  Level of Consciousness: awake, alert  and oriented  Airway & Oxygen Therapy: Patient Spontanous Breathing  Post-op Assessment: Report given to RN, Post -op Vital signs reviewed and stable and Patient moving all extremities  Post vital signs: Reviewed and stable  Last Vitals:  Vitals Value Taken Time  BP 116/98 05/02/20 1505  Temp    Pulse 119 05/02/20 1510  Resp 22 05/02/20 1510  SpO2 97 % 05/02/20 1510  Vitals shown include unvalidated device data.  Last Pain:  Vitals:   05/02/20 1351  TempSrc:   PainSc: 0-No pain      Patients Stated Pain Goal: 3 (05/02/20 1351)  Complications: No complications documented.

## 2020-05-02 NOTE — Progress Notes (Signed)
  Echocardiogram 2D Echocardiogram has been attempted. Physician consult in progress. Will reattempt at later time.  Robin Padilla 05/02/2020, 11:44 AM

## 2020-05-02 NOTE — H&P (Addendum)
History and Physical    Renly Guedes ZOX:[id number] DOB: 1970-08-17 DOA: 05/01/2020  PCP: Patient, No Pcp Per  Patient coming from: Home   Chief Complaint:  Chief Complaint  Patient presents with  . Abscess  . Rash     HPI:    50 year old female with no past medical history presents to [facility] emergency department with complaints of back pain.  Patient explains that approximately one and 1/2 weeks ago her back was "really itchy."  In an attempt to relieve her itching she says that she may have scratched her back with her fingernail.  She states that in the days that followed she began to develop a "burning" pain in that area with increasing swelling.  This burning pain was initially mild in intensity but progressively became more and more severe.  Pain is nonradiating.  Patient denies any associated nausea, vomiting, weakness, changes in appetite, fevers, abdominal pain.  Approximately 4 days prior to her presentation patient also began to develop a diffuse papular rash.  This rash spread over her torso arms and legs.  Patient denies intravenous drug use, new sexual partners, recent tick bites, recent camping, international travel or any new medications or herbal supplements.  Patient denies any sick contacts or recent contact with confirmed COVID-19 infection.  Because of progressively worsening back pain and swelling in addition to progressive diffuse rash the patient eventually presented to [facility] emergency department for evaluation.  Upon evaluation in the emergency department patient was found to have extensive soft tissue infection of the mid back and multiple SIRS criteria concerning for sepsis.  Patient was placed on intravenous Zosyn and vancomycin.  Patient was provided with 2 L of intravenous isotonic fluids.  Blood cultures were obtained.  Attempts were made to perform an I&D on the abscess resulting in 15 cc of foul-smelling purulent drainage.  Wound  was dressed and hospital service was then called to assess the patient for admission to the hospital.  Review of Systems:   Review of Systems  Constitutional: Positive for malaise/fatigue.  Musculoskeletal: Positive for back pain.  All other systems reviewed and are negative.   Past Medical History:  Diagnosis Date  . Chronic cough     History reviewed. No pertinent surgical history.   reports that she has never smoked. She has never used smokeless tobacco. She reports previous alcohol use. She reports previous drug use.  Not on File  Family History  Problem Relation Age of Onset  . Diabetes Mother      Prior to Admission medications   Not on File    Physical Exam: Vitals:   05/01/20 2115 05/01/20 2330 05/02/20 0115 05/02/20 0209  BP: 138/84  (!) 149/76   Pulse: (!) 124  (!) 129   Resp: 16  17   Temp: 99.8 F (37.7 C)   99.2 F (37.3 C)  TempSrc: Oral     SpO2: 100%  100%   Weight:  68 kg    Height:  5\' 1"  (1.549 m)      Constitutional: Awake alert and oriented x3, no associated distress.   Skin: Approximate 10 cm diameter area along the left mid back just beneath the left scapula that is hyperemic with a large central area of macerated skin and large area of obvious necrosis with foul-smelling purulent drainage.  Dressing is intact with some purulent drainage noted.  Diffuse maculopapular rash noted without evidence of vesicles or pustules.  Palms are seemingly spared.  Lesions  are nontender.  Otherwise, poor skin turgor noted. Eyes: Pupils are equally reactive to light.  No evidence of scleral icterus or conjunctival pallor.  ENMT: Moist mucous membranes noted.  Posterior pharynx clear of any exudate or lesions.   Neck: normal, supple, no masses, no thyromegaly.  No evidence of jugular venous distension.   Respiratory: clear to auscultation bilaterally, no wheezing, no crackles. Normal respiratory effort. No accessory muscle use.  Cardiovascular: Regular rate and  rhythm, no murmurs / rubs / gallops. No extremity edema. 2+ pedal pulses. No carotid bruits.  Chest:   Nontender without crepitus or deformity.   Back:   Skin examination findings as noted above.  Mid back and left flank are exquisitely tender.  No evidence of crepitus. Abdomen: Abdomen is soft and nontender.  No evidence of intra-abdominal masses.  Positive bowel sounds noted in all quadrants.   Musculoskeletal: No joint deformity upper and lower extremities. Good ROM, no contractures. Normal muscle tone.  Neurologic: CN 2-12 grossly intact. Sensation intact.  Patient moving all 4 extremities spontaneously.  Patient is following all commands.  Patient is responsive to verbal stimuli.   Psychiatric: Patient exhibits normal mood with appropriate affect.  Patient seems to possess insight as to their current situation.     Labs on Admission: I have personally reviewed following labs and imaging studies -   CBC: Recent Labs  Lab 05/01/20 1901  WBC 21.0*  NEUTROABS 16.8*  HGB 12.2  HCT 37.4  MCV 94.4  PLT 479*   Basic Metabolic Panel: Recent Labs  Lab 05/01/20 1901  NA 138  K 3.2*  CL 101  CO2 26  GLUCOSE 149*  BUN 10  CREATININE 0.83  CALCIUM 8.0*   GFR: Estimated Creatinine Clearance: 73.1 mL/min (by C-G formula based on SCr of 0.83 mg/dL). Liver Function Tests: Recent Labs  Lab 05/01/20 1901  AST 30  ALT 41  ALKPHOS 145*  BILITOT 0.6  PROT 6.1*  ALBUMIN 2.7*   No results for input(s): LIPASE, AMYLASE in the last 168 hours. No results for input(s): AMMONIA in the last 168 hours. Coagulation Profile: Recent Labs  Lab 05/01/20 2215  INR 1.0   Cardiac Enzymes: No results for input(s): CKTOTAL, CKMB, CKMBINDEX, TROPONINI in the last 168 hours. BNP (last 3 results) No results for input(s): PROBNP in the last 8760 hours. HbA1C: No results for input(s): HGBA1C in the last 72 hours. CBG: No results for input(s): GLUCAP in the last 168 hours. Lipid Profile: No  results for input(s): CHOL, HDL, LDLCALC, TRIG, CHOLHDL, LDLDIRECT in the last 72 hours. Thyroid Function Tests: No results for input(s): TSH, T4TOTAL, FREET4, T3FREE, THYROIDAB in the last 72 hours. Anemia Panel: No results for input(s): VITAMINB12, FOLATE, FERRITIN, TIBC, IRON, RETICCTPCT in the last 72 hours. Urine analysis:    Component Value Date/Time   COLORURINE YELLOW 05/01/2020 1901   APPEARANCEUR HAZY (A) 05/01/2020 1901   LABSPEC 1.018 05/01/2020 1901   PHURINE 5.0 05/01/2020 1901   GLUCOSEU NEGATIVE 05/01/2020 1901   HGBUR NEGATIVE 05/01/2020 1901   BILIRUBINUR NEGATIVE 05/01/2020 1901   KETONESUR NEGATIVE 05/01/2020 1901   PROTEINUR NEGATIVE 05/01/2020 1901   NITRITE NEGATIVE 05/01/2020 1901   LEUKOCYTESUR MODERATE (A) 05/01/2020 1901    Radiological Exams on Admission - Personally Reviewed: CT Chest W Contrast  Result Date: 05/02/2020 CLINICAL DATA:  Left posterior chest wall mass EXAM: CT CHEST WITH CONTRAST TECHNIQUE: Multidetector CT imaging of the chest was performed during intravenous contrast administration. CONTRAST:  75mL  OMNIPAQUE IOHEXOL 300 MG/ML  SOLN COMPARISON:  Abdominal ultrasound April 13, 2003. FINDINGS: Cardiovascular: No significant vascular findings. Normal heart size. No pericardial effusion. Mediastinum/Nodes: No enlarged mediastinal, hilar, or axillary lymph nodes. Thyroid gland, trachea, and esophagus demonstrate no significant findings. Lungs/Pleura: Lungs are clear. No pleural effusion or pneumothorax. Upper Abdomen: Multiple hypodense left upper pole renal lesions some of which are only partially visualized largest of which measures 2.3 cm on image 136/3 with density greater than that which would be expected for simple cyst. Musculoskeletal: Phlegmonous collection with adjacent inflammatory stranding and cutaneous skin thickening in the subcutaneous soft tissues of the left posterior thorax measuring 5.7 x 2.6 x 7.4 cm on image 34/7 and 87/3.  Consistently stool IMPRESSION: 1. Phlegmonous collection with adjacent inflammatory stranding and cutaneous skin thickening in the subcutaneous soft tissues of the left posterior thorax measuring 5.7 x 2.6 x 7.4 cm. Findings are most consistent with a soft tissue phlegmonous collection/developing abscess. 2. Multiple hypodense left upper pole renal lesions some of which are only partially visualized largest of which measures 2.3 cm with density greater than that would be expected for simple cyst. Further evaluation with nonemergent renal protocol abdominal MRI or CT with and without contrast in is recommended. Electronically Signed   By: Maudry Mayhew MD   On: 05/02/2020 01:05    Telemetry: Personally reviewed.  Rhythm is tachycardia with heart rate of 105 bpm.  No dynamic ST segment changes appreciated.  Assessment/Plan Principal Problem:   Sepsis [facility])   Patient presenting with extensive soft tissue infection of the mid back with multiple SIRS criteria including tachycardia and leukocytosis with concurrent lactic acidosis concerning for early sepsis.  Diffuse papular rash I believe is related to this soft tissue infection.   Considering significant purulence of the soft tissue infection MSSA or MRSA are the suspected organisms.  Patient is being hydrated aggressively with intravenous isotonic fluids  Broad-spectrum intravenous antibiotics being provided with intravenous cefepime and vancomycin.  CT imaging of the chest reveals no evidence of surrounding osteomyelitis or deep space infection.  That being said, area of involvement appears profoundly necrotic concerning for either malignancy or necrotic destruction status post to a venomous bite from an insect.    For source control, despite the I&D being attempted in the emergency department there will need to be extensive debridement of this area with general surgery.  Will consult them in the morning.  Active Problems:   Abscess of  back   Please see assessment and plan above surrounding concerns for need of surgical debridement of this abscess  Intravenous antibiotics as above  Placing request for wound care team consultation as well.  Concerning the possibility that this may be a metastatic lesion I believe that advanced imaging of the left kidney is necessary considering incidental findings of multiple left kidney lesions on CT imaging of the chest.  Maculopapular Rash   Unclear etiology  No evidence of vesicles or obvious pustules  No tick exposure   RPR pending but syphilis unlikely  HIV negative  No recent new drug exposure  Concern this could be somehow a disseminated form of patient's infection or possibly due to infective endocarditis?  Obtaining echo, following blood cultures.     Lactic acidosis   Notable lactic acidosis on arrival, likely secondary to underlying infection  Hydrating patient with intravenous isotonic fluids, treating patient with broad-spectrum intravenous antibiotic therapy  Performing serial lactic acid levels to ensure downtrending and resolution    Hypokalemia,  inadequate intake   Replacement oral potassium chloride  Serial chemistries to monitor potassium levels and magnesium levels.  Code Status:  Full code Family Communication: deferred   Status is: Observation  The patient remains OBS appropriate and will d/c before 2 midnights.  Dispo: The patient is from: Home              Anticipated d/c is to: Home              Patient currently is not medically stable to d/c.   Difficult to place patient No        Marinda Elk MD [facility] Pager [phone]  If 7PM-7AM, please contact night-coverage [email or web address] Use universal [place] password for that web site. If you do not have the password, please call the hospital operator.  05/02/2020, 4:29 AM

## 2020-05-02 NOTE — Plan of Care (Signed)

## 2020-05-02 NOTE — Op Note (Signed)
05/01/2020 - 05/02/2020  2:41 PM  PATIENT:  Robin Padilla  50 y.o. female  PRE-OPERATIVE DIAGNOSIS: Left lateral Back Abscess  POST-OPERATIVE DIAGNOSIS:  Left lateral back necrotic abscess  PROCEDURE:  Procedure(s): EXCISIONAL  DEBRIDEMENT OF LEFT LATERAL BACK NECROTIC PURULENT ABSCESS WITH SCALPEL AND CURETTE  SURGEON:  Surgeon(s): Gaynelle Adu, MD  ASSISTANTS: none   ANESTHESIA:   general  DRAINS: none   LOCAL MEDICATIONS USED:  NONE  SPECIMEN:  Source of Specimen:  left lateral back skin/soft tissue +cultures  DISPOSITION OF SPECIMEN:  micro and path  COUNTS:  YES  INDICATION FOR PROCEDURE: 50 year old female presents with several week history of a left back wound that has gotten progressively worse.  She denies any trauma or wound to the area.  She came in with a large left lateral back abscess.  The ER performed initial incision and drainage she was started on broad-spectrum IV antibiotics.  We were consulted for further evaluation.  There was clearly ongoing necrotic skin and soft tissue that needed additional debridement in the operating room.  Please see chart for additional detail  PROCEDURE: After discussing the risk and benefits of the case with the patient and her father the patient elected to proceed to the operating room.  The area was marked with my initials with the patient confirming the operative site.  She was brought to the OR 1 at [facility] and placed supine on the operating table.  Sequential compression devices were placed.  General endotracheal anesthesia was established.  She was placed in the right lateral position with the appropriate padding.  Her left lateral back was then prepped and draped in usual standard surgical fashion with Betadine.  She was on broad-spectrum IV antibiotics.  A surgical timeout was performed.  She had a large open abscess skin wound that extended into the soft tissue.  It was actively draining pus.  There is obvious  necrotic nonviable tissue.  The area of open wound measured approximately 9 cm by about 3-1/2 cm x 1 cm deep.  The induration extended further out.  Using a scalpel I elliptically incised the nonviable skin and soft tissue circumferentially.  The soft tissue/adipose tissue was liquefied.  The majority of the muscle was intact and viable.  I again sharply incised with scalpel to viable soft tissue.  Healthy bleeding was encountered.  Then using a curette I cleaned up the deep tissue mainly the fascia off of the muscle since it was nonviable.  Then I pulsatile lavaged the wound with 2 L of saline.  There was a fair amount of oozing from the wound bed.  Hemostasis was achieved with electrocautery.  2 pieces of Surgicel was placed in the wound bed followed by moist gauze followed by dry gauze and ABD.  All needle, instrument sponge counts were correct x2.    Tool used for debridement :curette, scapel    Frequency of surgical debridement.  Initial by [place]; ED performed I&d yesterday  .  Measurement of total devitalized tissue (wound surface) before and after surgical debridement.  9x2.5x1cm deep (before)  .  Area and depth of devitalized tissue removed from wound.     Blood loss and description of tissue removed.  50cc, skin, soft tissue, fat, fascia on muscle    Evidence of the progress of the wound's response to treatment.  A.  Current wound volume (current dimensions and depth).  11x5x2 cm deep    8.  Was there any viable tissue removed (measurements):  na   PLAN OF CARE: Admit to inpatient   PATIENT DISPOSITION:  PACU - hemodynamically stable.   Delay start of Pharmacological VTE agent (>24hrs) due to surgical blood loss or risk of bleeding:  no  Mary Sella. Andrey Campanile, MD, FACS General, Bariatric, & Minimally Invasive Surgery [facility], [place]

## 2020-05-02 NOTE — Anesthesia Procedure Notes (Signed)

## 2020-05-02 NOTE — ED Notes (Signed)
To ct

## 2020-05-02 NOTE — Anesthesia Preprocedure Evaluation (Addendum)
Anesthesia Evaluation  Patient identified by MRN, date of birth, ID band Patient awake    Reviewed: Allergy & Precautions, NPO status , Patient's Chart, lab work & pertinent test results  History of Anesthesia Complications Negative for: history of anesthetic complications  Airway Mallampati: II  TM Distance: >3 FB Neck ROM: Full    Dental  (+) Dental Advisory Given, Missing   Pulmonary Recent URI , Residual Cough,  05/01/2020 SARS coronavirus NEG   breath sounds clear to auscultation       Cardiovascular negative cardio ROS   Rhythm:Regular Rate:Normal     Neuro/Psych negative neurological ROS     GI/Hepatic negative GI ROS, Neg liver ROS,   Endo/Other  negative endocrine ROS  Renal/GU negative Renal ROS     Musculoskeletal   Abdominal   Peds  Hematology  (+) Blood dyscrasia (Hb 10.5), anemia ,   Anesthesia Other Findings CT chest that showed Phlegmonous collection with adjacent inflammatory stranding and cutaneous skin thickening in the subcutaneous soft tissues of the left posterior thorax measuring 5.7 x 2.6 x 7.4 cm  Reproductive/Obstetrics                            Anesthesia Physical Anesthesia Plan  ASA: II  Anesthesia Plan: General   Post-op Pain Management:    Induction: Intravenous and Rapid sequence  PONV Risk Score and Plan: 3 and Dexamethasone, Treatment may vary due to age or medical condition, Scopolamine patch - Pre-op and Ondansetron  Airway Management Planned: Oral ETT  Additional Equipment: None  Intra-op Plan:   Post-operative Plan: Possible Post-op intubation/ventilation  Informed Consent: I have reviewed the patients History and Physical, chart, labs and discussed the procedure including the risks, benefits and alternatives for the proposed anesthesia with the patient or authorized representative who has indicated his/her understanding and acceptance.      Dental advisory given and Consent reviewed with POA  Plan Discussed with: CRNA and Surgeon  Anesthesia Plan Comments: (Reviewed with patient and her father)       Anesthesia Quick Evaluation

## 2020-05-02 NOTE — ED Notes (Signed)
Patient transported to MRI 

## 2020-05-02 NOTE — Progress Notes (Addendum)
05/02/2020 4:41 AM Addendum: Changing Zosyn to Cefepime 2g IV q8h  Robin Padilla, PharmD, BCPS Clinical Pharmacist Phone: [phone] ============================    Pharmacy Antibiotic Note  Robin Padilla is a 50 y.o. female admitted on 05/01/2020 with abscess/wound infection.  Pharmacy has been consulted for Vancomycin/Zosyn dosing. Pt also reports rash upper/lower extremity rash. WBC is elevated. Renal function good.   Plan: Vancomycin 1250 mg IV q24h >>Estimated AUC: 453 Zosyn 3.375G IV q8h to be infused over 4 hours Trend WBC, temp, renal function  F/U infectious work-up Drug levels as indicated   Height: 5\' 1"  (154.9 cm) Weight: 68 kg (150 lb) IBW/kg (Calculated) : 47.8  Temp (24hrs), Avg:99.8 F (37.7 C), Min:99.8 F (37.7 C), Max:99.8 F (37.7 C)  Recent Labs  Lab 05/01/20 1901 05/01/20 2339  WBC 21.0*  --   CREATININE 0.83  --   LATICACIDVEN 2.0* 2.6*    Estimated Creatinine Clearance: 73.1 mL/min (by C-G formula based on SCr of 0.83 mg/dL).    Not on File  05/03/20, PharmD, BCPS Clinical Pharmacist Phone: [phone]

## 2020-05-02 NOTE — Sepsis Progress Note (Signed)
Notified bedside nurse of need to order repeat lactic acid.  

## 2020-05-02 NOTE — Care Plan (Addendum)
This 50 years old female with no PMH presents in the emergency department with back pain.  Patient has developed itching in the back which she reports, she has scratched with her fingernail, later has developed burning pain and swelling which has been increasing associated with pain.  Later she has developed diffuse papular rash.  Patient denies any tick bite, recent camping, IV drug abuse, new sexual partners, sick contacts.  Patient is admitted for sepsis secondary to possible abscess,  along with necrotic tissue in the back.  Patient is started on empiric antibiotics,  Patient underwent incision and drainage in the ED.  General surgical consulted,  Patient underwent debridement with removal of the necrotic tissue.  She is found to have leukocytosis and hypomagnesemia,  magnesium has been replaced. Patient was seen and examined in the ED.  Father was at bedside.

## 2020-05-02 NOTE — Anesthesia Postprocedure Evaluation (Signed)
Anesthesia Post Note  Patient: Robin Padilla  Procedure(s) Performed: IRRIGATION AND DEBRIDEMENT BACK  WOUND (Left )     Patient location during evaluation: PACU Anesthesia Type: General Level of consciousness: awake and alert, patient cooperative and oriented Pain control: pain improving. Vital Signs Assessment: post-procedure vital signs reviewed and stable Respiratory status: spontaneous breathing, nonlabored ventilation, respiratory function stable and patient connected to nasal cannula oxygen Cardiovascular status: blood pressure returned to baseline and stable Postop Assessment: no apparent nausea or vomiting Anesthetic complications: no   No complications documented.  Last Vitals:  Vitals:   05/02/20 1200 05/02/20 1503  BP: 125/74 (!) 116/98  Pulse: (!) 105 (!) 118  Resp: 18 (!) 21  Temp:  36.9 C  SpO2: 96% 96%    Last Pain:  Vitals:   05/02/20 1351  TempSrc:   PainSc: 0-No pain                 Bari Leib,E. Duana Benedict

## 2020-05-02 NOTE — Consult Note (Addendum)
Robin Padilla 1970/10/08  [id number].    Requesting MD: Dr. Lucianne Muss Chief Complaint/Reason for Consult: Left back wound  HPI: Robin Padilla is 50 y.o. with no significant past medical history presented to the ED on 3/27 for left back wound and rash.   Patient reports approximate 1.5 weeks ago she began noticing a stinging pain on her left back that was also itchy.  She notes when she tried to relieve her itch by scratching the area and it was "wet".  She reports 2 days after initial onset of pain she began noticing scabbing to the area along with continued drainage.  She believes the drainage was clear.  The area became larger each day and the pain mildly intensified.  Approximately 4 days ago she began having a diffuse rash to her torso, arms and legs.  She tried Benadryl for symptoms without any relief.  She denies any IVDU, tick bites/insect bites, exposure to the woods, new medications (oral any daily medications/supplements at all), sick contacts or anyone with similar symptoms as her.  She denies history of similar symptoms in the past.  She has never had an abscess in the past.  She denies associated fever, chills, weight loss, nausea or vomiting at home.    She underwent evaluation and was found to be febrile to 101.4, and tachycardic.  No hypotension.  WBC 20.5.  Lactic acid 2.0-> 2.6->1.1 after IV fluid resuscitation.  She underwent CT chest that showed Phlegmonous collection with adjacent inflammatory stranding and cutaneous skin thickening in the subcutaneous soft tissues of the left posterior thorax measuring 5.7 x 2.6 x 7.4 cm.  An I&D was performed in the emergency department by EDP. Patient was admitted by [facility] and started on IV abx. We were asked to see.   To note, CT also showed showed a hypodense left upper pole renal lesion.  TRH ordered MRI to evaluate for this.    ROS: Review of Systems  Constitutional: Positive for chills and fever. Negative for weight loss.   Respiratory: Positive for shortness of breath. Negative for cough.   Cardiovascular: Negative for chest pain and leg swelling.  Gastrointestinal: Negative for abdominal pain, constipation, diarrhea, nausea and vomiting.  Genitourinary: Negative for dysuria.  Musculoskeletal: Negative for joint pain.  Skin: Positive for rash.       wound  Psychiatric/Behavioral: Negative for substance abuse.  All other systems reviewed and are negative.   Family History  Problem Relation Age of Onset  . Diabetes Mother     Past Medical History:  Diagnosis Date  . Chronic cough     History reviewed. No pertinent surgical history.  Social History:  reports that she has never smoked. She has never used smokeless tobacco. She reports previous alcohol use. She reports previous drug use. Denies tobacco abuse, alcohol, IVDU.  Not working Lives at home with her father who is present a bedside.   Allergies:  Allergies  Allergen Reactions  . Chocolate Hives, Shortness Of Breath, Swelling and Rash  . Eggs Or Egg-Derived Products Other (See Comments)    Unknown, childhood   . Orange (Diagnostic) Other (See Comments)    Pt does not recall   . Cashew Nut (Anacardium Occidentale) Skin Test Hives, Swelling and Rash    (Not in a hospital admission)    Physical Exam: Blood pressure 113/71, pulse (!) 112, temperature (!) 101.4 F (38.6 C), temperature source Oral, resp. rate (!) 28, height 5\' 1"  (1.549 m), weight 68 kg, SpO2  97 %. General: pleasant, WD/WN white female who is laying in bed in NAD HEENT: head is normocephalic, atraumatic.  Sclera are noninjected.  PERRL.  Ears and nose without any masses or lesions.  Mouth is pink and moist. Dentition fair.  Heart: Tachycardic rate with regular rhythm.  Normal s1,s2. No obvious murmurs, gallops, or rubs noted.  Palpable pedal pulses bilaterally  Lungs: CTAB, no wheezes, rhonchi, or rales noted.  Respiratory effort nonlabored Abd: Soft, NT/ND, +BS, no  masses, hernias, or organomegaly MS: no BUE/BLE edema, calves soft and nontender Skin: Left back wound measuring 12x7x3cm area as seen below. The surronding wound is with erythema and induration. The wound has multiple pustules draining purulence with mixed necrotic/sloughing tissue. She is also with a diffuse MP rash of the arms, legs, neck and trunk. I do not see any lesions on the palms of the hands, soles of the feet, or oral mucosal surfaces. warm and dry with no masses, lesions, or rashes Psych: A&Ox4 with an appropriate affect Neuro: cranial nerves grossly intact, equal strength in BUE/BLE bilaterally, normal speech, thought process intact, gait not assessed.         Results for orders placed or performed during the hospital encounter of 05/01/20 (from the past 48 hour(s))  Lactic acid, plasma     Status: Abnormal   Collection Time: 05/01/20  7:01 PM  Result Value Ref Range   Lactic Acid, Venous 2.0 (HH) 0.5 - 1.9 mmol/L    Comment: CRITICAL RESULT CALLED TO, READ BACK BY AND VERIFIED WITH: K.ISLEY RN @ 1958 05/01/2020 BY C.EDENS Performed at [facility], [address] [place], [place] [address]   Comprehensive metabolic panel     Status: Abnormal   Collection Time: 05/01/20  7:01 PM  Result Value Ref Range   Sodium 138 135 - 145 mmol/L   Potassium 3.2 (L) 3.5 - 5.1 mmol/L   Chloride 101 98 - 111 mmol/L   CO2 26 22 - 32 mmol/L   Glucose, Bld 149 (H) 70 - 99 mg/dL    Comment: Glucose reference range applies only to samples taken after fasting for at least 8 hours.   BUN 10 6 - 20 mg/dL   Creatinine, Ser 6.04 0.44 - 1.00 mg/dL   Calcium 8.0 (L) 8.9 - 10.3 mg/dL   Total Protein 6.1 (L) 6.5 - 8.1 g/dL   Albumin 2.7 (L) 3.5 - 5.0 g/dL   AST 30 15 - 41 U/L   ALT 41 0 - 44 U/L   Alkaline Phosphatase 145 (H) 38 - 126 U/L   Total Bilirubin 0.6 0.3 - 1.2 mg/dL   GFR, Estimated >54 >09 mL/min    Comment: (NOTE) Calculated using the CKD-EPI Creatinine Equation (2021)     Anion gap 11 5 - 15    Comment: Performed at [facility], [address] [place], [place] [address]  CBC with Differential     Status: Abnormal   Collection Time: 05/01/20  7:01 PM  Result Value Ref Range   WBC 21.0 (H) 4.0 - 10.5 K/uL   RBC 3.96 3.87 - 5.11 MIL/uL   Hemoglobin 12.2 12.0 - 15.0 g/dL   HCT 47.8 29.5 - 62.1 %   MCV 94.4 80.0 - 100.0 fL   MCH 30.8 26.0 - 34.0 pg   MCHC 32.6 30.0 - 36.0 g/dL   RDW 30.8 65.7 - 84.6 %   Platelets 479 (H) 150 - 400 K/uL   nRBC 0.0 0.0 -  0.2 %   Neutrophils Relative % 80 %   Neutro Abs 16.8 (H) 1.7 - 7.7 K/uL   Lymphocytes Relative 13 %   Lymphs Abs 2.7 0.7 - 4.0 K/uL   Monocytes Relative 4 %   Monocytes Absolute 0.9 0.1 - 1.0 K/uL   Eosinophils Relative 2 %   Eosinophils Absolute 0.4 0.0 - 0.5 K/uL   Basophils Relative 0 %   Basophils Absolute 0.0 0.0 - 0.1 K/uL   Immature Granulocytes 1 %   Abs Immature Granulocytes 0.20 (H) 0.00 - 0.07 K/uL    Comment: Performed at [facility], [address], [place], [place] [address]  Urinalysis, Routine w reflex microscopic Urine, Clean Catch     Status: Abnormal   Collection Time: 05/01/20  7:01 PM  Result Value Ref Range   Color, Urine YELLOW YELLOW   APPearance HAZY (A) CLEAR   Specific Gravity, Urine 1.018 1.005 - 1.030   pH 5.0 5.0 - 8.0   Glucose, UA NEGATIVE NEGATIVE mg/dL   Hgb urine dipstick NEGATIVE NEGATIVE   Bilirubin Urine NEGATIVE NEGATIVE   Ketones, ur NEGATIVE NEGATIVE mg/dL   Protein, ur NEGATIVE NEGATIVE mg/dL   Nitrite NEGATIVE NEGATIVE   Leukocytes,Ua MODERATE (A) NEGATIVE   RBC / HPF 6-10 0 - 5 RBC/hpf   WBC, UA 11-20 0 - 5 WBC/hpf   Bacteria, UA RARE (A) NONE SEEN   Squamous Epithelial / LPF 0-5 0 - 5   Mucus PRESENT     Comment: Performed at [facility], [address] [place], [place] [address]  I-Stat beta hCG blood, ED     Status: None   Collection Time: 05/01/20  7:11 PM  Result Value Ref Range   I-stat hCG, quantitative <5.0 <5  mIU/mL   Comment 3            Comment:   GEST. AGE      CONC.  (mIU/mL)   <=1 WEEK        5 - 50     2 WEEKS       50 - 500     3 WEEKS       100 - 10,000     4 WEEKS     1,000 - 30,000        FEMALE AND NON-PREGNANT FEMALE:     LESS THAN 5 mIU/mL   RPR     Status: None   Collection Time: 05/01/20 10:15 PM  Result Value Ref Range   RPR Ser Ql NON REACTIVE NON REACTIVE    Comment: Performed at [facility], [address] [place], [place] [address]  HIV Antibody (routine testing w rflx)     Status: None   Collection Time: 05/01/20 10:15 PM  Result Value Ref Range   HIV Screen 4th Generation wRfx Non Reactive Non Reactive    Comment: Performed at [facility], [address] [place], [place] [address]  Protime-INR     Status: None   Collection Time: 05/01/20 10:15 PM  Result Value Ref Range   Prothrombin Time 13.1 11.4 - 15.2 seconds   INR 1.0 0.8 - 1.2    Comment: (NOTE) INR goal varies based on device and disease states. Performed at [facility], [address] [place], [place] [address]   APTT     Status: None   Collection Time: 05/01/20 10:15 PM  Result Value Ref Range   aPTT 36 24 -  36 seconds    Comment: Performed at [facility], [address], [place], [place] [address]  Blood Culture (routine x 2)     Status: None (Preliminary result)   Collection Time: 05/01/20 11:18 PM   Specimen: BLOOD LEFT HAND  Result Value Ref Range   Specimen Description BLOOD LEFT HAND    Special Requests      BOTTLES DRAWN AEROBIC AND ANAEROBIC Blood Culture adequate volume   Culture      NO GROWTH < 12 HOURS Performed at [facility], [address] [place], [place] [address]    Report Status PENDING   Lactic acid, plasma     Status: Abnormal   Collection Time: 05/01/20 11:39 PM  Result Value Ref Range   Lactic Acid, Venous 2.6 (HH) 0.5 - 1.9 mmol/L    Comment: CRITICAL VALUE NOTED.  VALUE IS CONSISTENT WITH PREVIOUSLY REPORTED AND CALLED  VALUE. Performed at [facility], [address], [place], [place] [address]   Resp Panel by RT-PCR (Flu A&B, Covid) Nasopharyngeal Swab     Status: None   Collection Time: 05/01/20 11:47 PM   Specimen: Nasopharyngeal Swab; Nasopharyngeal(NP) swabs in vial transport medium  Result Value Ref Range   SARS Coronavirus 2 by RT PCR NEGATIVE NEGATIVE    Comment: (NOTE) SARS-CoV-2 target nucleic acids are NOT DETECTED.  The SARS-CoV-2 RNA is generally detectable in upper respiratory specimens during the acute phase of infection. The lowest concentration of SARS-CoV-2 viral copies this assay can detect is 138 copies/mL. A negative result does not preclude SARS-Cov-2 infection and should not be used as the sole basis for treatment or other patient management decisions. A negative result may occur with  improper specimen collection/handling, submission of specimen other than nasopharyngeal swab, presence of viral mutation(s) within the areas targeted by this assay, and inadequate number of viral copies(<138 copies/mL). A negative result must be combined with clinical observations, patient history, and epidemiological information. The expected result is Negative.  Fact Sheet for Patients:  BloggerCourse.com  Fact Sheet for Healthcare Providers:  SeriousBroker.it  This test is no t yet approved or cleared by the [place] FDA and  has been authorized for detection and/or diagnosis of SARS-CoV-2 by FDA under an Emergency Use Authorization (EUA). This EUA will remain  in effect (meaning this test can be used) for the duration of the COVID-19 declaration under Section 564(b)(1) of the Act, 21 U.S.C.section 360bbb-3(b)(1), unless the authorization is terminated  or revoked sooner.       Influenza A by PCR NEGATIVE NEGATIVE   Influenza B by PCR NEGATIVE NEGATIVE    Comment: (NOTE) The Xpert Xpress SARS-CoV-2/FLU/RSV plus assay is  intended as an aid in the diagnosis of influenza from Nasopharyngeal swab specimens and should not be used as a sole basis for treatment. Nasal washings and aspirates are unacceptable for Xpert Xpress SARS-CoV-2/FLU/RSV testing.  Fact Sheet for Patients: BloggerCourse.com  Fact Sheet for Healthcare Providers: SeriousBroker.it  This test is not yet approved or cleared by the [place] FDA and has been authorized for detection and/or diagnosis of SARS-CoV-2 by FDA under an Emergency Use Authorization (EUA). This EUA will remain in effect (meaning this test can be used) for the duration of the COVID-19 declaration under Section 564(b)(1) of the Act, 21 U.S.C. section 360bbb-3(b)(1), unless the authorization is terminated or revoked.  Performed at [facility], [address], [place], [place] [address]   Hemoglobin A1c  Status: Abnormal   Collection Time: 05/02/20  8:20 AM  Result Value Ref Range   Hgb A1c MFr Bld 5.8 (H) 4.8 - 5.6 %    Comment: (NOTE) Pre diabetes:          5.7%-6.4%  Diabetes:              >6.4%  Glycemic control for   <7.0% adults with diabetes    Mean Plasma Glucose 119.76 mg/dL    Comment: Performed at [facility], [address], [place], [place] [address]  Protime-INR     Status: None   Collection Time: 05/02/20  8:20 AM  Result Value Ref Range   Prothrombin Time 14.1 11.4 - 15.2 seconds   INR 1.1 0.8 - 1.2    Comment: (NOTE) INR goal varies based on device and disease states. Performed at [facility], [address], [place], [place] [address]   Cortisol-am, blood     Status: None   Collection Time: 05/02/20  8:20 AM  Result Value Ref Range   Cortisol - AM 12.0 6.7 - 22.6 ug/dL    Comment: Performed at [facility], [address], [place], [place] [address]  Procalcitonin     Status: None   Collection Time: 05/02/20  8:20 AM  Result Value Ref Range    Procalcitonin 0.28 ng/mL    Comment:        Interpretation: PCT (Procalcitonin) <= 0.5 ng/mL: Systemic infection (sepsis) is not likely. Local bacterial infection is possible. (NOTE)       Sepsis PCT Algorithm           Lower Respiratory Tract                                      Infection PCT Algorithm    ----------------------------     ----------------------------         PCT < 0.25 ng/mL                PCT < 0.10 ng/mL          Strongly encourage             Strongly discourage   discontinuation of antibiotics    initiation of antibiotics    ----------------------------     -----------------------------       PCT 0.25 - 0.50 ng/mL            PCT 0.10 - 0.25 ng/mL               OR       >80% decrease in PCT            Discourage initiation of                                            antibiotics      Encourage discontinuation           of antibiotics    ----------------------------     -----------------------------         PCT >= 0.50 ng/mL              PCT 0.26 - 0.50 ng/mL               AND        <  80% decrease in PCT             Encourage initiation of                                             antibiotics       Encourage continuation           of antibiotics    ----------------------------     -----------------------------        PCT >= 0.50 ng/mL                  PCT > 0.50 ng/mL               AND         increase in PCT                  Strongly encourage                                      initiation of antibiotics    Strongly encourage escalation           of antibiotics                                     -----------------------------                                           PCT <= 0.25 ng/mL                                                 OR                                        > 80% decrease in PCT                                      Discontinue / Do not initiate                                             antibiotics  Performed at [facility], [address], [place], [place] [address]   Comprehensive metabolic panel     Status: Abnormal   Collection Time: 05/02/20  8:20 AM  Result Value Ref Range   Sodium 139 135 - 145 mmol/L   Potassium 3.4 (L) 3.5 - 5.1 mmol/L   Chloride 103 98 - 111 mmol/L   CO2 29 22 - 32 mmol/L   Glucose, Bld 137 (H) 70 - 99 mg/dL    Comment: Glucose reference range applies only to samples taken after fasting for at least 8 hours.   BUN 5 (L) 6 - 20  mg/dL   Creatinine, Ser 2.45 0.44 - 1.00 mg/dL   Calcium 7.9 (L) 8.9 - 10.3 mg/dL   Total Protein 4.8 (L) 6.5 - 8.1 g/dL   Albumin 2.2 (L) 3.5 - 5.0 g/dL   AST 22 15 - 41 U/L   ALT 30 0 - 44 U/L   Alkaline Phosphatase 103 38 - 126 U/L   Total Bilirubin 1.0 0.3 - 1.2 mg/dL   GFR, Estimated >80 >99 mL/min    Comment: (NOTE) Calculated using the CKD-EPI Creatinine Equation (2021)    Anion gap 7 5 - 15    Comment: Performed at [facility], [address], [place], [place] [address]  Magnesium     Status: Abnormal   Collection Time: 05/02/20  8:20 AM  Result Value Ref Range   Magnesium 0.5 (LL) 1.7 - 2.4 mg/dL    Comment: CRITICAL RESULT CALLED TO, READ BACK BY AND VERIFIED WITH: OAKLEY,A RN @1003  ON BY FLEMINGS Performed at [facility], [address] [place], [place]   CBC WITH DIFFERENTIAL     Status: Abnormal   Collection Time: 05/02/20  8:20 AM  Result Value Ref Range   WBC 22.5 (H) 4.0 - 10.5 K/uL   RBC 3.39 (L) 3.87 - 5.11 MIL/uL   Hemoglobin 10.5 (L) 12.0 - 15.0 g/dL   HCT 05/04/20 (L) 79.0 - 24.0 %   MCV 97.1 80.0 - 100.0 fL   MCH 31.0 26.0 - 34.0 pg   MCHC 31.9 30.0 - 36.0 g/dL   RDW 97.3 53.2 - 99.2 %   Platelets 428 (H) 150 - 400 K/uL   nRBC 0.0 0.0 - 0.2 %   Neutrophils Relative % 87 %   Neutro Abs 19.4 (H) 1.7 - 7.7 K/uL   Lymphocytes Relative 7 %   Lymphs Abs 1.7 0.7 - 4.0 K/uL   Monocytes Relative 4 %   Monocytes Absolute 1.0 0.1 - 1.0 K/uL   Eosinophils Relative 1 %   Eosinophils Absolute 0.3  0.0 - 0.5 K/uL   Basophils Relative 0 %   Basophils Absolute 0.0 0.0 - 0.1 K/uL   Immature Granulocytes 1 %   Abs Immature Granulocytes 0.17 (H) 0.00 - 0.07 K/uL    Comment: Performed at [facility], [address], [place], [place]  Lactic acid, plasma     Status: None   Collection Time: 05/02/20  8:20 AM  Result Value Ref Range   Lactic Acid, Venous 1.1 0.5 - 1.9 mmol/L    Comment: Performed at [facility], [address], [place], [place]   CT Chest W Contrast  Result Date: 05/02/2020 CLINICAL DATA:  Left posterior chest wall mass EXAM: CT CHEST WITH CONTRAST TECHNIQUE: Multidetector CT imaging of the chest was performed during intravenous contrast administration. CONTRAST:  47mL OMNIPAQUE IOHEXOL 300 MG/ML  SOLN COMPARISON:  Abdominal ultrasound April 13, 2003. FINDINGS: Cardiovascular: No significant vascular findings. Normal heart size. No pericardial effusion. Mediastinum/Nodes: No enlarged mediastinal, hilar, or axillary lymph nodes. Thyroid gland, trachea, and esophagus demonstrate no significant findings. Lungs/Pleura: Lungs are clear. No pleural effusion or pneumothorax. Upper Abdomen: Multiple hypodense left upper pole renal lesions some of which are only partially visualized largest of which measures 2.3 cm on image 136/3 with density greater than that which would be expected for simple cyst. Musculoskeletal: Phlegmonous collection with adjacent inflammatory stranding and cutaneous skin thickening in the subcutaneous soft tissues of the left posterior  thorax measuring 5.7 x 2.6 x 7.4 cm on image 34/7 and 87/3. Consistently stool IMPRESSION: 1. Phlegmonous collection with adjacent inflammatory stranding and cutaneous skin thickening in the subcutaneous soft tissues of the left posterior thorax measuring 5.7 x 2.6 x 7.4 cm. Findings are most consistent with a soft tissue phlegmonous collection/developing abscess. 2. Multiple hypodense left upper pole renal  lesions some of which are only partially visualized largest of which measures 2.3 cm with density greater than that would be expected for simple cyst. Further evaluation with nonemergent renal protocol abdominal MRI or CT with and without contrast in is recommended. Electronically Signed   By: Maudry MayhewJeffrey  Waltz MD   On: 05/02/2020 01:05   Anti-infectives (From admission, onward)   Start     Dose/Rate Route Frequency Ordered Stop   05/02/20 2200  Vancomycin (VANCOCIN) 1,250 mg in sodium chloride 0.9 % 250 mL IVPB  Status:  Discontinued        1,250 mg 166.7 mL/hr over 90 Minutes Intravenous Every 24 hours 05/02/20 0030 05/02/20 0030   05/02/20 2200  vancomycin (VANCOREADY) IVPB 1250 mg/250 mL        1,250 mg 166.7 mL/hr over 90 Minutes Intravenous Every 24 hours 05/02/20 0031     05/02/20 1000  piperacillin-tazobactam (ZOSYN) IVPB 3.375 g  Status:  Discontinued        3.375 g 12.5 mL/hr over 240 Minutes Intravenous Every 8 hours 05/02/20 0030 05/02/20 0428   05/02/20 0600  ceFEPIme (MAXIPIME) 2 g in sodium chloride 0.9 % 100 mL IVPB        2 g 200 mL/hr over 30 Minutes Intravenous Every 8 hours 05/02/20 0440     05/01/20 2345  vancomycin (VANCOCIN) IVPB 1000 mg/200 mL premix        1,000 mg 200 mL/hr over 60 Minutes Intravenous  Once 05/01/20 2337 05/02/20 0326   05/01/20 2345  piperacillin-tazobactam (ZOSYN) IVPB 3.375 g        3.375 g 100 mL/hr over 30 Minutes Intravenous  Once 05/01/20 2337 05/02/20 0049      Assessment/Plan Hypokalemia - K 3.4, being replaced Hypomagnesemia - Mg 0.5, being replaced L renal lesion - TRH has ordred MRI abdomen to further eval  Sepsis - febrile to 101.4, and tachycardic.  No hypotension.  WBC 20.5.  Lactic acid 2.0-> 2.6->1.1 after IV fluid resuscitation. On IV abx. Blood cx's, ucx's, etc pending. Continue IVF resuscitation.  Diffuse MP rash - Per TRH. Denies recent tic bites, IVDU, new medications, etc. They have ordred echo to r/o infective  endocarditis. Blood cx's as above. [facility] further workup[facility]. Necrotic Left back wound - s/p I&D in the ED by EDP on 3/28. Will plan for OR today for further debridement of the wound by Dr. Andrey CampanileWilson. Agree with IV abx and further resuscitation by TRH. Please keep NPO.   FEN - NPO, IVF per TRH VTE - SCDs, Loveonx ID - [email or web address]/[email or web address]  Jacinto HalimMichael M Maczis, [facility] 05/02/2020, 11:38 AM Please see Amion for pager number during day hours 7:00am-4:30pm

## 2020-05-02 NOTE — ED Notes (Signed)
Dr. Edsel Petrin

## 2020-05-03 ENCOUNTER — Encounter (HOSPITAL_COMMUNITY): Payer: Self-pay | Admitting: General Surgery

## 2020-05-03 ENCOUNTER — Inpatient Hospital Stay (HOSPITAL_COMMUNITY): Payer: Self-pay

## 2020-05-03 DIAGNOSIS — I38 Endocarditis, valve unspecified: Secondary | ICD-10-CM

## 2020-05-03 LAB — URINE CULTURE

## 2020-05-03 LAB — ECHOCARDIOGRAM COMPLETE
Area-P 1/2: 4.41 cm2
Height: 61 in
S' Lateral: 2.1 cm
Weight: 2402.13 oz

## 2020-05-03 LAB — CBC
HCT: 27.8 % — ABNORMAL LOW (ref 36.0–46.0)
Hemoglobin: 9.1 g/dL — ABNORMAL LOW (ref 12.0–15.0)
MCH: 31.5 pg (ref 26.0–34.0)
MCHC: 32.7 g/dL (ref 30.0–36.0)
MCV: 96.2 fL (ref 80.0–100.0)
Platelets: 409 10*3/uL — ABNORMAL HIGH (ref 150–400)
RBC: 2.89 MIL/uL — ABNORMAL LOW (ref 3.87–5.11)
RDW: 13 % (ref 11.5–15.5)
WBC: 15.5 10*3/uL — ABNORMAL HIGH (ref 4.0–10.5)
nRBC: 0 % (ref 0.0–0.2)

## 2020-05-03 LAB — BASIC METABOLIC PANEL
Anion gap: 5 (ref 5–15)
BUN: 5 mg/dL — ABNORMAL LOW (ref 6–20)
CO2: 33 mmol/L — ABNORMAL HIGH (ref 22–32)
Calcium: 7.8 mg/dL — ABNORMAL LOW (ref 8.9–10.3)
Chloride: 102 mmol/L (ref 98–111)
Creatinine, Ser: 0.69 mg/dL (ref 0.44–1.00)
GFR, Estimated: >60 mL/min (ref >60)
Glucose, Bld: 106 mg/dL — ABNORMAL HIGH (ref 70–99)
Potassium: 3.9 mmol/L (ref 3.5–5.1)
Sodium: 140 mmol/L (ref 135–145)

## 2020-05-03 LAB — MAGNESIUM: Magnesium: 1.3 mg/dL — ABNORMAL LOW (ref 1.7–2.4)

## 2020-05-03 LAB — PHOSPHORUS: Phosphorus: 3.7 mg/dL (ref 2.5–4.6)

## 2020-05-03 MED ORDER — MAGNESIUM SULFATE 4 GM/100ML IV SOLN: 4.0000 g | Freq: Once | INTRAVENOUS | Status: DC

## 2020-05-03 MED ORDER — MAGNESIUM SULFATE 2 GM/50ML IV SOLN
2.0000 g | Freq: Once | INTRAVENOUS | Status: AC
  Administered 2020-05-03: 2 g via INTRAVENOUS
  Filled 2020-05-03: qty 50

## 2020-05-03 NOTE — Progress Notes (Signed)
[facility] Progress Note  1 Day Post-Op  Subjective: CC:  Denies pain. Tolerating PO.  States her dad feels he can help her with wound care at home.  Objective: Vital signs in last 24 hours: Temp:  [97.6 F (36.4 C)-99.5 F (37.5 C)] 98.4 F (36.9 C) (03/29 0528) Pulse Rate:  [98-118] 102 (03/29 0528) Resp:  [16-30] 18 (03/29 0528) BP: (101-125)/(54-98) 116/70 (03/29 0528) SpO2:  [91 %-100 %] 100 % (03/29 0528) Weight:  [68.1 kg] 68.1 kg (03/28 2135) Last BM Date: 05/02/20  Intake/Output from previous day: 03/28 0701 - 03/29 0700 In: 1975.3 [P.O.:340; I.V.:835.3; IV Piggyback:800] Out: 0  Intake/Output this shift: No intake/output data recorded.  PE: Gen:  Alert, NAD, pleasant Pulm:  Normal effort ORA Abd: Soft, non-tender, non-distended Skin: warm and dry, diffuse rash Left lateral back wound: 9x4x1cm wound as below. Wound base clean. Dark tissue is from cautery. No surrounding cellulitis.     Psych: A&Ox3   Lab Results:  Recent Labs    05/02/20 0820 05/02/20 1427 05/03/20 0336  WBC 22.5*  --  15.5*  HGB 10.5* 10.5* 9.1*  HCT 32.9* 31.0* 27.8*  PLT 428*  --  409*   BMET Recent Labs    05/02/20 0820 05/02/20 1427 05/03/20 0336  NA 139 139 140  K 3.4* 3.1* 3.9  CL 103  --  102  CO2 29  --  33*  GLUCOSE 137*  --  106*  BUN 5*  --  5*  CREATININE 0.70  --  0.69  CALCIUM 7.9*  --  7.8*   PT/INR Recent Labs    05/01/20 2215 05/02/20 0820  LABPROT 13.1 14.1  INR 1.0 1.1   CMP     Component Value Date/Time   NA 140 05/03/2020 0336   K 3.9 05/03/2020 0336   CL 102 05/03/2020 0336   CO2 33 (H) 05/03/2020 0336   GLUCOSE 106 (H) 05/03/2020 0336   BUN 5 (L) 05/03/2020 0336   CREATININE 0.69 05/03/2020 0336   CALCIUM 7.8 (L) 05/03/2020 0336   PROT 4.8 (L) 05/02/2020 0820   ALBUMIN 2.2 (L) 05/02/2020 0820   AST 22 05/02/2020 0820   ALT 30 05/02/2020 0820   ALKPHOS 103 05/02/2020 0820   BILITOT 1.0 05/02/2020 0820   GFRNONAA  >60 05/03/2020 0336   Lipase  No results found for: LIPASE     Studies/Results: CT Chest W Contrast  Result Date: 05/02/2020 CLINICAL DATA:  Left posterior chest wall mass EXAM: CT CHEST WITH CONTRAST TECHNIQUE: Multidetector CT imaging of the chest was performed during intravenous contrast administration. CONTRAST:  10mL OMNIPAQUE IOHEXOL 300 MG/ML  SOLN COMPARISON:  Abdominal ultrasound April 13, 2003. FINDINGS: Cardiovascular: No significant vascular findings. Normal heart size. No pericardial effusion. Mediastinum/Nodes: No enlarged mediastinal, hilar, or axillary lymph nodes. Thyroid gland, trachea, and esophagus demonstrate no significant findings. Lungs/Pleura: Lungs are clear. No pleural effusion or pneumothorax. Upper Abdomen: Multiple hypodense left upper pole renal lesions some of which are only partially visualized largest of which measures 2.3 cm on image 136/3 with density greater than that which would be expected for simple cyst. Musculoskeletal: Phlegmonous collection with adjacent inflammatory stranding and cutaneous skin thickening in the subcutaneous soft tissues of the left posterior thorax measuring 5.7 x 2.6 x 7.4 cm on image 34/7 and 87/3. Consistently stool IMPRESSION: 1. Phlegmonous collection with adjacent inflammatory stranding and cutaneous skin thickening in the subcutaneous soft tissues of the left posterior thorax measuring 5.7 x 2.6  x 7.4 cm. Findings are most consistent with a soft tissue phlegmonous collection/developing abscess. 2. Multiple hypodense left upper pole renal lesions some of which are only partially visualized largest of which measures 2.3 cm with density greater than that would be expected for simple cyst. Further evaluation with nonemergent renal protocol abdominal MRI or CT with and without contrast in is recommended. Electronically Signed   By: Maudry Mayhew MD   On: 05/02/2020 01:05   MR ABDOMEN W WO CONTRAST  Result Date: 05/02/2020 CLINICAL DATA:   Indeterminate left renal lesion on chest CT. Evaluate for renal mass. EXAM: MRI ABDOMEN WITHOUT AND WITH CONTRAST TECHNIQUE: Multiplanar multisequence MR imaging of the abdomen was performed both before and after the administration of intravenous contrast. CONTRAST:  45mL GADAVIST GADOBUTROL 1 MMOL/ML IV SOLN COMPARISON:  Chest CT same date.  Abdominal ultrasound 04/13/2003. FINDINGS: Despite efforts by the technologist and patient, mild motion artifact is present on today's exam and could not be eliminated. This reduces exam sensitivity and specificity. Lower chest:  The visualized lower chest appears unremarkable. Hepatobiliary: The liver appears unremarkable. The gallbladder is mildly distended. No evidence of gallstones, gallbladder wall thickening or biliary dilatation. Pancreas: Unremarkable. No pancreatic ductal dilatation or surrounding inflammatory changes. Spleen: Normal in size without focal abnormality. Adrenals/Urinary Tract: Both adrenal glands appear normal. Renal assessment is limited by the motion artifact. There are multiple cystic appearing left renal cortical and parapelvic lesions. Anteriorly in the upper pole of the left kidney, there is a T1 hyperintense lesion measuring 2.3 cm on image 37/15. There are additional T1 hyperintense lesions in the interpolar region, measuring up to 1.4 cm on image 46/15. No appreciable enhancement of these lesions is seen, although subtracted images were not obtained due to the motion. There are tiny right renal cortical cysts. No evidence of enhancing renal mass or hydronephrosis. Stomach/Bowel: The stomach appears unremarkable for its degree of distension. No evidence of bowel wall thickening, distention or surrounding inflammatory change. Vascular/Lymphatic: There are no enlarged abdominal lymph nodes. No significant vascular findings. Other: Subcutaneous edema and enhancement posteriorly in the lower left chest wall as seen on earlier chest CT. The abdominal  wall appears intact. No ascites. Musculoskeletal: No acute or significant osseous findings. IMPRESSION: 1. 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 No evidence of enhancing renal mass. The examination is limited by motion artifact. 2. Subcutaneous edema and enhancement posteriorly in the lower left chest wall as seen on earlier chest CT. Electronically Signed   By: Carey Bullocks M.D.   On: 05/02/2020 10:47    Anti-infectives: Anti-infectives (From admission, onward)   Start     Dose/Rate Route Frequency Ordered Stop   05/02/20 2200  Vancomycin (VANCOCIN) 1,250 mg in sodium chloride 0.9 % 250 mL IVPB  Status:  Discontinued        1,250 mg 166.7 mL/hr over 90 Minutes Intravenous Every 24 hours 05/02/20 0030 05/02/20 0030   05/02/20 2200  vancomycin (VANCOREADY) IVPB 1250 mg/250 mL        1,250 mg 166.7 mL/hr over 90 Minutes Intravenous Every 24 hours 05/02/20 0031     05/02/20 1000  piperacillin-tazobactam (ZOSYN) IVPB 3.375 g  Status:  Discontinued        3.375 g 12.5 mL/hr over 240 Minutes Intravenous Every 8 hours 05/02/20 0030 05/02/20 0428   05/02/20 0600  ceFEPIme (MAXIPIME) 2 g in sodium chloride 0.9 % 100 mL IVPB  2 g 200 mL/hr over 30 Minutes Intravenous Every 8 hours 05/02/20 0440     05/01/20 2345  vancomycin (VANCOCIN) IVPB 1000 mg/200 mL premix        1,000 mg 200 mL/hr over 60 Minutes Intravenous  Once 05/01/20 2337 05/02/20 0326   05/01/20 2345  piperacillin-tazobactam (ZOSYN) IVPB 3.375 g        3.375 g 100 mL/hr over 30 Minutes Intravenous  Once 05/01/20 2337 05/02/20 0049     Assessment/Plan L renal lesion Fever, tachycardia  Diffuse MP rash - echo done this morning 3/29 to r/o endocarditis  --per [facility]--   Left lateral back wound  - GS: abundant G+ cocci in pairs in clusters  - twice daily moist-to-dry dressing changes  - continue IV abx - no further surgical debridement necessary at this time.   FEN:  reg ID: vancomycin, cefepime VTE: Lovenox, SCD's  Foley: none Dispo: med-surg, IV abx, dressing changes, infectious workup per primary team     LOS: 1 day    Hosie Spangle, [facility] Please see Amion for pager number during day hours 7:00am-4:30pm

## 2020-05-03 NOTE — Progress Notes (Signed)
PROGRESS NOTE    Darthula CreePeri Jeffersond  ZOX:[id number] DOB: Nov 16, 1970 DOA: 05/01/2020 PCP: Patient, No Pcp Per (Inactive)    Brief Narrative:  This 50 years old female with no PMH presents in the emergency department with back pain.  Patient has developed itching in the back which she reports, she has scratched with her fingernail, later has developed burning pain and swelling which has been increasing associated with pain.  Later she has developed diffuse papular rash.  Patient denies any tick bite, recent camping, IV drug abuse, new sexual partners, sick contacts. Patient is admitted for sepsis secondary to possible back abscess,  along with necrotic tissue in the back. Patient is started on empiric antibiotics,  Patient underwent incision and drainage in the ED.  General surgical consulted, Patient underwent debridement with removal of the necrotic tissue.  She is found to have leukocytosis and hypomagnesemia,  magnesium has been replaced.   Assessment & Plan:   Principal Problem:   Sepsis (HCC) Active Problems:   Abscess of back   Lactic acidosis   Hypokalemia, inadequate intake   Lesion of left native kidney   Sepsis secondary to necrotic back abscess: Patient presented with tachycardia, leukocytosis, lactic acidosis, extensive soft tissue infection. Patient was aggressively hydrated with intravenous fluids. Patient was started on empiric antibiotic vancomycin and cefepime. CT chest reveals no evidence of surrounding osteomyelitis or deep space infection. Patient underwent incision and drainage in the ED. Given significant necrotic tissue, general surgery was consulted. Patient underwent debridement with removal of necrotic tissue on 3/28. General surgery recommended continue antibiotics and dry to the dressing daily. MRI multiple renal cysts.  No abscess  Lactic acidosis:> Resolved. Lactic acid on Arrival 2.6, resolved wit IV fluids. 1.1  Hypokalemia : > Resolved. Replacement  completed.   Hypomagnesemia: Replacement in progress,  recheck labs in the morning.  DVT prophylaxis: Lovenox Code Status: Full code : family Communication: Father was at bedside Disposition Plan:  Status is: Inpatient  Remains inpatient appropriate because:Hemodynamically unstable and Inpatient level of care appropriate due to severity of illness   Dispo: The patient is from: Home              Anticipated d/c is to: Home              Patient currently is not medically stable to d/c.   Difficult to place patient No   Consultants:   General surgery  Procedures: Debridement of back wound Antimicrobials:  Anti-infectives (From admission, onward)   Start     Dose/Rate Route Frequency Ordered Stop   05/02/20 2200  Vancomycin (VANCOCIN) 1,250 mg in sodium chloride 0.9 % 250 mL IVPB  Status:  Discontinued        1,250 mg 166.7 mL/hr over 90 Minutes Intravenous Every 24 hours 05/02/20 0030 05/02/20 0030   05/02/20 2200  vancomycin (VANCOREADY) IVPB 1250 mg/250 mL        1,250 mg 166.7 mL/hr over 90 Minutes Intravenous Every 24 hours 05/02/20 0031     05/02/20 1000  piperacillin-tazobactam (ZOSYN) IVPB 3.375 g  Status:  Discontinued        3.375 g 12.5 mL/hr over 240 Minutes Intravenous Every 8 hours 05/02/20 0030 05/02/20 0428   05/02/20 0600  ceFEPIme (MAXIPIME) 2 g in sodium chloride 0.9 % 100 mL IVPB        2 g 200 mL/hr over 30 Minutes Intravenous Every 8 hours 05/02/20 0440     05/01/20 2345  vancomycin (VANCOCIN) IVPB 1000  mg/200 mL premix        1,000 mg 200 mL/hr over 60 Minutes Intravenous  Once 05/01/20 2337 05/02/20 0326   05/01/20 2345  piperacillin-tazobactam (ZOSYN) IVPB 3.375 g        3.375 g 100 mL/hr over 30 Minutes Intravenous  Once 05/01/20 2337 05/02/20 0049      Subjective: Patient was seen and examined at bedside.  Overnight events noted.   Patient been lying on the right side, states the pain is better controlled.   She underwent debridement of  the back wound.  She is on antibiotics.  Objective: Vitals:   05/02/20 1615 05/02/20 2135 05/03/20 0528 05/03/20 1026  BP: (!) 105/54 111/62 116/70 105/66  Pulse: (!) 110 98 (!) 102 (!) 102  Resp: Temp: 98.8 F (37.1 C) 97.6 F (36.4 C) 98.4 F (36.9 C) 98.4 F (36.9 C)  TempSrc: Oral Oral  Oral  SpO2: 96% 91% 100% 95%  Weight:  68.1 kg    Height:        Intake/Output Summary (Last 24 hours) at 05/03/2020 1434 Last data filed at 05/03/2020 0606 Gross per 24 hour  Intake 1875.26 ml  Output 0 ml  Net 1875.26 ml   Filed Weights   05/01/20 2330 05/02/20 2135  Weight: 68 kg 68.1 kg    Examination:  General exam: Appears calm and comfortable, not in any acute distress. Respiratory system: Clear to auscultation. Respiratory effort normal. Cardiovascular system: S1 & S2 heard, RRR. No JVD, murmurs, rubs, gallops or clicks. No pedal edema. Gastrointestinal system: Abdomen is nondistended, soft and nontender. No organomegaly or masses felt.  Normal bowel sounds heard. Central nervous system: Alert and oriented. No focal neurological deficits. Back: Wound appears clear with slight erythema around. Extremities: Symmetric 5 x 5 power.  No edema, no cyanosis, no clubbing. Skin: No rashes, lesions or ulcers Psychiatry: Judgement and insight appear normal. Mood & affect appropriate.     Data Reviewed: I have personally reviewed following labs and imaging studies  CBC: Recent Labs  Lab 05/01/20 1901 05/02/20 0820 05/02/20 1427 05/03/20 0336  WBC 21.0* 22.5*  --  15.5*  NEUTROABS 16.8* 19.4*  --   --   HGB 12.2 10.5* 10.5* 9.1*  HCT 37.4 32.9* 31.0* 27.8*  MCV 94.4 97.1  --  96.2  PLT 479* 428*  --  409*   Basic Metabolic Panel: Recent Labs  Lab 05/01/20 1901 05/02/20 0820 05/02/20 1427 05/03/20 0336  NA 138 139 139 140  K 3.2* 3.4* 3.1* 3.9  CL 101 103  --  102  CO2 26 29  --  33*  GLUCOSE 149* 137*  --  106*  BUN 10 5*  --  5*  CREATININE 0.83  0.70  --  0.69  CALCIUM 8.0* 7.9*  --  7.8*  MG  --  0.5*  --  1.3*  PHOS  --   --   --  3.7   GFR: Estimated Creatinine Clearance: 75.9 mL/min (by C-G formula based on SCr of 0.69 mg/dL). Liver Function Tests: Recent Labs  Lab 05/01/20 1901 05/02/20 0820  AST 30 22  ALT 41 30  ALKPHOS 145* 103  BILITOT 0.6 1.0  PROT 6.1* 4.8*  ALBUMIN 2.7* 2.2*   No results for input(s): LIPASE, AMYLASE in the last 168 hours. No results for input(s): AMMONIA in the last 168 hours. Coagulation Profile: Recent Labs  Lab 05/01/20 2215 05/02/20 0820  INR 1.0 1.1  Cardiac Enzymes: No results for input(s): CKTOTAL, CKMB, CKMBINDEX, TROPONINI in the last 168 hours. BNP (last 3 results) No results for input(s): PROBNP in the last 8760 hours. HbA1C: Recent Labs    05/02/20 0820  HGBA1C 5.8*   CBG: Recent Labs  Lab 05/02/20 1229  GLUCAP 117*   Lipid Profile: No results for input(s): CHOL, HDL, LDLCALC, TRIG, CHOLHDL, LDLDIRECT in the last 72 hours. Thyroid Function Tests: No results for input(s): TSH, T4TOTAL, FREET4, T3FREE, THYROIDAB in the last 72 hours. Anemia Panel: No results for input(s): VITAMINB12, FOLATE, FERRITIN, TIBC, IRON, RETICCTPCT in the last 72 hours. Sepsis Labs: Recent Labs  Lab 05/01/20 1901 05/01/20 2339 05/02/20 0820  PROCALCITON  --   --  0.28  LATICACIDVEN 2.0* 2.6* 1.1    Recent Results (from the past 240 hour(s))  Blood Culture (routine x 2)     Status: None (Preliminary result)   Collection Time: 05/01/20 10:27 PM   Specimen: BLOOD RIGHT ARM  Result Value Ref Range Status   Specimen Description BLOOD RIGHT ARM  Final   Special Requests   Final    BOTTLES DRAWN AEROBIC ONLY Blood Culture results may not be optimal due to an inadequate volume of blood received in culture bottles   Culture   Final    NO GROWTH < 24 HOURS Performed at [facility], [address], [place], [place] [address]    Report Status PENDING  Incomplete  Urine  culture     Status: Abnormal   Collection Time: 05/01/20 10:55 PM   Specimen: In/Out Cath Urine  Result Value Ref Range Status   Specimen Description IN/OUT CATH URINE  Final   Special Requests   Final    NONE Performed at [facility], [address] [place], [place] [address]    Culture MULTIPLE SPECIES PRESENT, SUGGEST RECOLLECTION (A)  Final   Report Status 05/03/2020 FINAL  Final  Blood Culture (routine x 2)     Status: None (Preliminary result)   Collection Time: 05/01/20 11:18 PM   Specimen: BLOOD LEFT HAND  Result Value Ref Range Status   Specimen Description BLOOD LEFT HAND  Final   Special Requests   Final    BOTTLES DRAWN AEROBIC AND ANAEROBIC Blood Culture adequate volume   Culture   Final    NO GROWTH 2 DAYS Performed at [facility], [address], [place], [place] [address]    Report Status PENDING  Incomplete  Resp Panel by RT-PCR (Flu A&B, Covid) Nasopharyngeal Swab     Status: None   Collection Time: 05/01/20 11:47 PM   Specimen: Nasopharyngeal Swab; Nasopharyngeal(NP) swabs in vial transport medium  Result Value Ref Range Status   SARS Coronavirus 2 by RT PCR NEGATIVE NEGATIVE Final    Comment: (NOTE) SARS-CoV-2 target nucleic acids are NOT DETECTED.  The SARS-CoV-2 RNA is generally detectable in upper respiratory specimens during the acute phase of infection. The lowest concentration of SARS-CoV-2 viral copies this assay can detect is 138 copies/mL. A negative result does not preclude SARS-Cov-2 infection and should not be used as the sole basis for treatment or other patient management decisions. A negative result may occur with  improper specimen collection/handling, submission of specimen other than nasopharyngeal swab, presence of viral mutation(s) within the areas targeted by this assay, and inadequate number of viral copies(<138 copies/mL). A negative result must be combined with clinical observations, patient history, and  epidemiological information. The expected result is Negative.  Fact Sheet for Patients:  BloggerCourse.com  Fact Sheet for Healthcare Providers:  SeriousBroker.it  This test is no t yet approved or cleared by the [place] FDA and  has been authorized for detection and/or diagnosis of SARS-CoV-2 by FDA under an Emergency Use Authorization (EUA). This EUA will remain  in effect (meaning this test can be used) for the duration of the COVID-19 declaration under Section 564(b)(1) of the Act, 21 U.S.C.section 360bbb-3(b)(1), unless the authorization is terminated  or revoked sooner.       Influenza A by PCR NEGATIVE NEGATIVE Final   Influenza B by PCR NEGATIVE NEGATIVE Final    Comment: (NOTE) The Xpert Xpress SARS-CoV-2/FLU/RSV plus assay is intended as an aid in the diagnosis of influenza from Nasopharyngeal swab specimens and should not be used as a sole basis for treatment. Nasal washings and aspirates are unacceptable for Xpert Xpress SARS-CoV-2/FLU/RSV testing.  Fact Sheet for Patients: BloggerCourse.com  Fact Sheet for Healthcare Providers: SeriousBroker.it  This test is not yet approved or cleared by the [place] FDA and has been authorized for detection and/or diagnosis of SARS-CoV-2 by FDA under an Emergency Use Authorization (EUA). This EUA will remain in effect (meaning this test can be used) for the duration of the COVID-19 declaration under Section 564(b)(1) of the Act, 21 U.S.C. section 360bbb-3(b)(1), unless the authorization is terminated or revoked.  Performed at [facility], [address], [place], [place] [address]   Aerobic/Anaerobic Culture w Gram Stain (surgical/deep wound)     Status: None (Preliminary result)   Collection Time: 05/02/20  2:29 PM   Specimen: PATH Other; Tissue  Result Value Ref Range Status   Specimen Description  ABSCESS LEFT BACK  Final   Special Requests NONE  Final   Gram Stain   Final    ABUNDANT WBC PRESENT, PREDOMINANTLY PMN ABUNDANT GRAM POSITIVE COCCI IN PAIRS IN CLUSTERS Performed at [facility], [address], [place], [place] [address]    Culture ABUNDANT STAPHYLOCOCCUS AUREUS  Final   Report Status PENDING  Incomplete   Radiology Studies: CT Chest W Contrast  Result Date: 05/02/2020 CLINICAL DATA:  Left posterior chest wall mass EXAM: CT CHEST WITH CONTRAST TECHNIQUE: Multidetector CT imaging of the chest was performed during intravenous contrast administration. CONTRAST:  60mL OMNIPAQUE IOHEXOL 300 MG/ML  SOLN COMPARISON:  Abdominal ultrasound April 13, 2003. FINDINGS: Cardiovascular: No significant vascular findings. Normal heart size. No pericardial effusion. Mediastinum/Nodes: No enlarged mediastinal, hilar, or axillary lymph nodes. Thyroid gland, trachea, and esophagus demonstrate no significant findings. Lungs/Pleura: Lungs are clear. No pleural effusion or pneumothorax. Upper Abdomen: Multiple hypodense left upper pole renal lesions some of which are only partially visualized largest of which measures 2.3 cm on image 136/3 with density greater than that which would be expected for simple cyst. Musculoskeletal: Phlegmonous collection with adjacent inflammatory stranding and cutaneous skin thickening in the subcutaneous soft tissues of the left posterior thorax measuring 5.7 x 2.6 x 7.4 cm on image 34/7 and 87/3. Consistently stool IMPRESSION: 1. Phlegmonous collection with adjacent inflammatory stranding and cutaneous skin thickening in the subcutaneous soft tissues of the left posterior thorax measuring 5.7 x 2.6 x 7.4 cm. Findings are most consistent with a soft tissue phlegmonous collection/developing abscess. 2. Multiple hypodense left upper pole renal lesions some of which are only partially visualized largest of which measures 2.3 cm with density greater than that would be expected  for simple cyst. Further evaluation with nonemergent renal protocol  abdominal MRI or CT with and without contrast in is recommended. Electronically Signed   By: Maudry Mayhew MD   On: 05/02/2020 01:05   MR ABDOMEN W WO CONTRAST  Result Date: 05/02/2020 CLINICAL DATA:  Indeterminate left renal lesion on chest CT. Evaluate for renal mass. EXAM: MRI ABDOMEN WITHOUT AND WITH CONTRAST TECHNIQUE: Multiplanar multisequence MR imaging of the abdomen was performed both before and after the administration of intravenous contrast. CONTRAST:  7mL GADAVIST GADOBUTROL 1 MMOL/ML IV SOLN COMPARISON:  Chest CT same date.  Abdominal ultrasound 04/13/2003. FINDINGS: Despite efforts by the technologist and patient, mild motion artifact is present on today's exam and could not be eliminated. This reduces exam sensitivity and specificity. Lower chest:  The visualized lower chest appears unremarkable. Hepatobiliary: The liver appears unremarkable. The gallbladder is mildly distended. No evidence of gallstones, gallbladder wall thickening or biliary dilatation. Pancreas: Unremarkable. No pancreatic ductal dilatation or surrounding inflammatory changes. Spleen: Normal in size without focal abnormality. Adrenals/Urinary Tract: Both adrenal glands appear normal. Renal assessment is limited by the motion artifact. There are multiple cystic appearing left renal cortical and parapelvic lesions. Anteriorly in the upper pole of the left kidney, there is a T1 hyperintense lesion measuring 2.3 cm on image 37/15. There are additional T1 hyperintense lesions in the interpolar region, measuring up to 1.4 cm on image 46/15. No appreciable enhancement of these lesions is seen, although subtracted images were not obtained due to the motion. There are tiny right renal cortical cysts. No evidence of enhancing renal mass or hydronephrosis. Stomach/Bowel: The stomach appears unremarkable for its degree of distension. No evidence of bowel wall thickening,  distention or surrounding inflammatory change. Vascular/Lymphatic: There are no enlarged abdominal lymph nodes. No significant vascular findings. Other: Subcutaneous edema and enhancement posteriorly in the lower left chest wall as seen on earlier chest CT. The abdominal wall appears intact. No ascites. Musculoskeletal: No acute or significant osseous findings. IMPRESSION: 1. 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 No evidence of enhancing renal mass. The examination is limited by motion artifact. 2. Subcutaneous edema and enhancement posteriorly in the lower left chest wall as seen on earlier chest CT. Electronically Signed   By: Carey Bullocks M.D.   On: 05/02/2020 10:47   ECHOCARDIOGRAM COMPLETE  Result Date: 05/03/2020    ECHOCARDIOGRAM REPORT   Patient Name:   JACKELINE Koranda Date of Exam: 05/03/2020 Medical Rec #:  [id number]       Height:       61.0 in Accession #:    [id number]      Weight:       150.1 lb Date of Birth:  [birth year]       BSA:          1.672 m Patient Age:    49 years        BP:           116/70 mmHg Patient Gender: F               HR:           102 bpm. Exam Location:  Inpatient Procedure: 2D Echo Indications:    Endocarditis I38  History:        Patient has no prior history of Echocardiogram examinations.                 Risk Factors:Non-Smoker. Sepsis, Abscess of back, Lactic  acidosis, no past medical history.  Sonographer:    Jeryl Columbia Referring Phys: [id number] MICHAEL M MACZIS IMPRESSIONS  1. Left ventricular ejection fraction, by estimation, is 60 to 65%. The left ventricle has normal function. The left ventricle has no regional wall motion abnormalities. Left ventricular diastolic parameters were normal.  2. Right ventricular systolic function is normal. The right ventricular size is normal. There is normal pulmonary artery systolic pressure.  3. The mitral valve is normal in structure. No evidence of mitral valve  regurgitation. No evidence of mitral stenosis.  4. The aortic valve is normal in structure. Aortic valve regurgitation is not visualized. No aortic stenosis is present.  5. The inferior vena cava is normal in size with greater than 50% respiratory variability, suggesting right atrial pressure of 3 mmHg. FINDINGS  Left Ventricle: Left ventricular ejection fraction, by estimation, is 60 to 65%. The left ventricle has normal function. The left ventricle has no regional wall motion abnormalities. The left ventricular internal cavity size was normal in size. There is  no left ventricular hypertrophy. Left ventricular diastolic parameters were normal. Indeterminate filling pressures. Right Ventricle: The right ventricular size is normal. No increase in right ventricular wall thickness. Right ventricular systolic function is normal. There is normal pulmonary artery systolic pressure. The tricuspid regurgitant velocity is 2.28 m/s, and  with an assumed right atrial pressure of 3 mmHg, the estimated right ventricular systolic pressure is 23.8 mmHg. Left Atrium: Left atrial size was normal in size. Right Atrium: Right atrial size was normal in size. Pericardium: There is no evidence of pericardial effusion. Mitral Valve: The mitral valve is normal in structure. No evidence of mitral valve regurgitation. No evidence of mitral valve stenosis. Tricuspid Valve: The tricuspid valve is normal in structure. Tricuspid valve regurgitation is trivial. No evidence of tricuspid stenosis. Aortic Valve: The aortic valve is normal in structure. Aortic valve regurgitation is not visualized. No aortic stenosis is present. Pulmonic Valve: The pulmonic valve was normal in structure. Pulmonic valve regurgitation is not visualized. No evidence of pulmonic stenosis. Aorta: The aortic root is normal in size and structure. Venous: The inferior vena cava is normal in size with greater than 50% respiratory variability, suggesting right atrial pressure  of 3 mmHg. IAS/Shunts: No atrial level shunt detected by color flow Doppler.  LEFT VENTRICLE PLAX 2D LVIDd:         3.75 cm  Diastology LVIDs:         2.10 cm  LV e' medial:    7.62 cm/s LV PW:         0.84 cm  LV E/e' medial:  10.4 LV IVS:        0.85 cm  LV e' lateral:   9.25 cm/s LVOT diam:     1.70 cm  LV E/e' lateral: 8.6 LVOT Area:     2.27 cm  RIGHT VENTRICLE RV S prime:     18.10 cm/s TAPSE (M-mode): 1.6 cm LEFT ATRIUM             Index      RIGHT ATRIUM          Index LA diam:        2.20 cm 1.32 cm/m RA Area:     6.82 cm LA Vol (A2C):   15.1 ml 9.03 ml/m RA Volume:   12.00 ml 7.18 ml/m LA Vol (A4C):   11.1 ml 6.64 ml/m LA Biplane Vol: 13.2 ml 7.89 ml/m   AORTA Ao Root diam:  2.40 cm MITRAL VALVE               TRICUSPID VALVE MV Area (PHT): 4.41 cm    TR Peak grad:   20.8 mmHg MV Decel Time: 172 msec    TR Vmax:        228.00 cm/s MV E velocity: 79.30 cm/s MV A velocity: 72.40 cm/s  SHUNTS MV E/A ratio:  1.10        Systemic Diam: 1.70 cm Chilton Si MD Electronically signed by Chilton Si MD Signature Date/Time: 05/03/2020/11:17:02 AM    Final     Scheduled Meds: . acetaminophen  1,000 mg Oral Q6H  . docusate sodium  100 mg Oral BID  . enoxaparin (LOVENOX) injection  40 mg Subcutaneous Daily   Continuous Infusions: . ceFEPime (MAXIPIME) IV 2 g (05/03/20 0606)  . magnesium sulfate bolus IVPB    . vancomycin 1,250 mg (05/02/20 2218)     LOS: 1 day    Time spent: 35 mins    Enrique Weiss, MD [facility]   If 7PM-7AM, please contact night-coverage

## 2020-05-03 NOTE — Plan of Care (Signed)

## 2020-05-03 NOTE — Progress Notes (Signed)
*  PRELIMINARY RESULTS* Echocardiogram 2D Echocardiogram has been performed.  Robin Padilla 05/03/2020, 9:12 AM

## 2020-05-03 NOTE — TOC Initial Note (Signed)
Transition of Care [facility]) - Initial/Assessment Note    Patient Details  Name: Robin Padilla MRN: [id number] Date of Birth: Jun 14, 1970  Transition of [facility]) CM/SW Contact:    Bess Kinds, RN Phone Number: [phone] 05/03/2020, 1:00 PM  Clinical Narrative:                  Spoke with patient at the bedside. Confirmed no insurance, no PCP. PTA home with dad. Drives, but only when necessary. Declined need for home health stating that her dad is capable and willing to do her dressing changes. Agreeable to [facility] scheduling hospital follow up at [facility]. Discussed dc medications - agreeable to using [facility] pharmacy - will follow for MATCH. TOC following for transition needs.   Expected Discharge Plan: Home/Self Care Barriers to Discharge: Continued Medical Work up   Patient Goals and CMS Choice Patient states their goals for this hospitalization and ongoing recovery are:: return home CMS Medicare.gov Compare Post Acute Care list provided to:: Patient Choice offered to / list presented to : NA  Expected Discharge Plan and Services Expected Discharge Plan: Home/Self Care In-house Referral: NA Discharge Planning Services: CM Consult Post Acute Care Choice: NA Living arrangements for the past 2 months: Single Family Home                 DME Arranged: N/A DME Agency: NA       HH Arranged: NA HH Agency: NA        Prior Living Arrangements/Services Living arrangements for the past 2 months: Single Family Home Lives with:: Self,Parents Patient language and need for interpreter reviewed:: Yes Do you feel safe going back to the place where you live?: Yes      Need for Family Participation in Patient Care: Yes (Comment) Care giver support system in place?: Yes (comment)   Criminal Activity/Legal Involvement Pertinent to Current Situation/Hospitalization: No - Comment as needed  Activities of Daily Living Home Assistive Devices/Equipment: None ADL Screening  (condition at time of admission) Patient's cognitive ability adequate to safely complete daily activities?: Yes Is the patient deaf or have difficulty hearing?: No Does the patient have difficulty seeing, even when wearing glasses/contacts?: No Does the patient have difficulty concentrating, remembering, or making decisions?: No Patient able to express need for assistance with ADLs?: Yes Does the patient have difficulty dressing or bathing?: No Independently performs ADLs?: Yes (appropriate for developmental age) Does the patient have difficulty walking or climbing stairs?: No Weakness of Legs: None Weakness of Arms/Hands: None  Permission Sought/Granted                  Emotional Assessment Appearance:: Appears stated age Attitude/Demeanor/Rapport: Engaged Affect (typically observed): Accepting Orientation: : Oriented to Self,Oriented to  Time,Oriented to Place,Oriented to Situation Alcohol / Substance Use: Not Applicable Psych Involvement: No (comment)  Admission diagnosis:  Abscess [L02.91] Sepsis (HCC) [A41.9] Sepsis, due to unspecified organism, unspecified whether acute organ dysfunction present [facility]) [A41.9] Patient Active Problem List   Diagnosis Date Noted  . Sepsis (HCC) 05/02/2020  . Abscess of back 05/02/2020  . Lactic acidosis 05/02/2020  . Hypokalemia, inadequate intake 05/02/2020  . Lesion of left native kidney 05/02/2020   PCP:  Patient, No Pcp Per Pharmacy:   [facility] [phone] Ginette Otto, [place] - [address] [place] [address] Phone: [phone] Fax: [phone]     Social Determinants of Health (SDOH) Interventions    Readmission Risk Interventions No flowsheet data  found.  

## 2020-05-04 DIAGNOSIS — A419 Sepsis, unspecified organism: Principal | ICD-10-CM

## 2020-05-04 LAB — CBC
HCT: 30.4 % — ABNORMAL LOW (ref 36.0–46.0)
Hemoglobin: 9.8 g/dL — ABNORMAL LOW (ref 12.0–15.0)
MCH: 31.1 pg (ref 26.0–34.0)
MCHC: 32.2 g/dL (ref 30.0–36.0)
MCV: 96.5 fL (ref 80.0–100.0)
Platelets: 537 10*3/uL — ABNORMAL HIGH (ref 150–400)
RBC: 3.15 MIL/uL — ABNORMAL LOW (ref 3.87–5.11)
RDW: 13.2 % (ref 11.5–15.5)
WBC: 9 10*3/uL (ref 4.0–10.5)
nRBC: 0 % (ref 0.0–0.2)

## 2020-05-04 LAB — BASIC METABOLIC PANEL
Anion gap: 10 (ref 5–15)
BUN: 6 mg/dL (ref 6–20)
CO2: 31 mmol/L (ref 22–32)
Calcium: 8.3 mg/dL — ABNORMAL LOW (ref 8.9–10.3)
Chloride: 100 mmol/L (ref 98–111)
Creatinine, Ser: 0.66 mg/dL (ref 0.44–1.00)
GFR, Estimated: >60 mL/min (ref >60)
Glucose, Bld: 102 mg/dL — ABNORMAL HIGH (ref 70–99)
Potassium: 3.7 mmol/L (ref 3.5–5.1)
Sodium: 141 mmol/L (ref 135–145)

## 2020-05-04 LAB — SURGICAL PATHOLOGY

## 2020-05-04 LAB — MAGNESIUM: Magnesium: 1.1 mg/dL — ABNORMAL LOW (ref 1.7–2.4)

## 2020-05-04 LAB — PHOSPHORUS: Phosphorus: 3.1 mg/dL (ref 2.5–4.6)

## 2020-05-04 MED ORDER — OXYCODONE HCL 5 MG PO TABS: 5.0000 mg | ORAL_TABLET | Freq: Four times a day (QID) | ORAL | 0 refills | Status: AC | PRN

## 2020-05-04 MED ORDER — MAGNESIUM SULFATE 4 GM/100ML IV SOLN
4.0000 g | Freq: Once | INTRAVENOUS | Status: AC
  Administered 2020-05-04: 4 g via INTRAVENOUS
  Filled 2020-05-04: qty 100

## 2020-05-04 MED ORDER — MAGNESIUM OXIDE 400 MG PO TABS
400.0000 mg | ORAL_TABLET | Freq: Two times a day (BID) | ORAL | 0 refills | Status: DC
Start: 2020-05-04 — End: 2020-05-16

## 2020-05-04 MED ORDER — SULFAMETHOXAZOLE-TRIMETHOPRIM 800-160 MG PO TABS: 1.0000 | ORAL_TABLET | Freq: Two times a day (BID) | ORAL | 0 refills | Status: DC

## 2020-05-04 NOTE — Progress Notes (Signed)
Pt. complain of twitching on her  Arm that started this afternoon, comes and goes. Did not witness by this RN. Sent secure chat to MD Opyd oncall night provider made him aware. See new order. No other complaints at this time

## 2020-05-04 NOTE — Progress Notes (Signed)
2 Days Post-Op  Subjective: CC: Patient reports some pain with dressing changes but otherwise having minimal pain over the wound at rest. Her father is going to be taking care of this at home for her.   Objective: Vital signs in last 24 hours: Temp:  [98.1 F (36.7 C)-99.7 F (37.6 C)] 98.1 F (36.7 C) (03/30 0607) Pulse Rate:  [97-108] 97 (03/30 0607) Resp:  [18] 18 (03/30 0607) BP: (105-120)/(65-82) 105/66 (03/30 0607) SpO2:  [94 %-98 %] 96 % (03/30 0607) Last BM Date: 05/02/20  Intake/Output from previous day: 03/29 0701 - 03/30 0700 In: 830 [P.O.:480; IV Piggyback:350] Out: 0  Intake/Output this shift: No intake/output data recorded.  PE: Left lateral back wound: 9x4x1cm wound as below. Wound base clean. Dark tissue is from cautery. No surrounding cellulitis.       Lab Results:  Recent Labs    05/03/20 0336 05/04/20 0703  WBC 15.5* 9.0  HGB 9.1* 9.8*  HCT 27.8* 30.4*  PLT 409* 537*   BMET Recent Labs    05/03/20 0336 05/04/20 0703  NA 140 141  K 3.9 3.7  CL 102 100  CO2 33* 31  GLUCOSE 106* 102*  BUN 5* 6  CREATININE 0.69 0.66  CALCIUM 7.8* 8.3*   PT/INR Recent Labs    05/01/20 2215 05/02/20 0820  LABPROT 13.1 14.1  INR 1.0 1.1   CMP     Component Value Date/Time   NA 141 05/04/2020 0703   K 3.7 05/04/2020 0703   CL 100 05/04/2020 0703   CO2 31 05/04/2020 0703   GLUCOSE 102 (H) 05/04/2020 0703   BUN 6 05/04/2020 0703   CREATININE 0.66 05/04/2020 0703   CALCIUM 8.3 (L) 05/04/2020 0703   PROT 4.8 (L) 05/02/2020 0820   ALBUMIN 2.2 (L) 05/02/2020 0820   AST 22 05/02/2020 0820   ALT 30 05/02/2020 0820   ALKPHOS 103 05/02/2020 0820   BILITOT 1.0 05/02/2020 0820   GFRNONAA >60 05/04/2020 0703   Lipase  No results found for: LIPASE     Studies/Results: MR ABDOMEN W WO CONTRAST  Result Date: 05/02/2020 CLINICAL DATA:  Indeterminate left renal lesion on chest CT. Evaluate for renal mass. EXAM: MRI ABDOMEN WITHOUT AND WITH  CONTRAST TECHNIQUE: Multiplanar multisequence MR imaging of the abdomen was performed both before and after the administration of intravenous contrast. CONTRAST:  40mL GADAVIST GADOBUTROL 1 MMOL/ML IV SOLN COMPARISON:  Chest CT same date.  Abdominal ultrasound 04/13/2003. FINDINGS: Despite efforts by the technologist and patient, mild motion artifact is present on today's exam and could not be eliminated. This reduces exam sensitivity and specificity. Lower chest:  The visualized lower chest appears unremarkable. Hepatobiliary: The liver appears unremarkable. The gallbladder is mildly distended. No evidence of gallstones, gallbladder wall thickening or biliary dilatation. Pancreas: Unremarkable. No pancreatic ductal dilatation or surrounding inflammatory changes. Spleen: Normal in size without focal abnormality. Adrenals/Urinary Tract: Both adrenal glands appear normal. Renal assessment is limited by the motion artifact. There are multiple cystic appearing left renal cortical and parapelvic lesions. Anteriorly in the upper pole of the left kidney, there is a T1 hyperintense lesion measuring 2.3 cm on image 37/15. There are additional T1 hyperintense lesions in the interpolar region, measuring up to 1.4 cm on image 46/15. No appreciable enhancement of these lesions is seen, although subtracted images were not obtained due to the motion. There are tiny right renal cortical cysts. No evidence of enhancing renal mass or hydronephrosis. Stomach/Bowel: The stomach appears  unremarkable for its degree of distension. No evidence of bowel wall thickening, distention or surrounding inflammatory change. Vascular/Lymphatic: There are no enlarged abdominal lymph nodes. No significant vascular findings. Other: Subcutaneous edema and enhancement posteriorly in the lower left chest wall as seen on earlier chest CT. The abdominal wall appears intact. No ascites. Musculoskeletal: No acute or significant osseous findings. IMPRESSION: 1.  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 No evidence of enhancing renal mass. The examination is limited by motion artifact. 2. Subcutaneous edema and enhancement posteriorly in the lower left chest wall as seen on earlier chest CT. Electronically Signed   By: Carey Bullocks M.D.   On: 05/02/2020 10:47   ECHOCARDIOGRAM COMPLETE  Result Date: 05/03/2020    ECHOCARDIOGRAM REPORT   Patient Name:   Robin Padilla Date of Exam: 05/03/2020 Medical Rec #:  [id number]       Height:       61.0 in Accession #:    [id number]      Weight:       150.1 lb Date of Birth:  16-Jan-1971       BSA:          1.672 m Patient Age:    49 years        BP:           116/70 mmHg Patient Gender: F               HR:           102 bpm. Exam Location:  Inpatient Procedure: 2D Echo Indications:    Endocarditis I38  History:        Patient has no prior history of Echocardiogram examinations.                 Risk Factors:Non-Smoker. Sepsis, Abscess of back, Lactic                 acidosis, no past medical history.  Sonographer:    Jeryl Columbia Referring Phys: [id number] Sylas Twombly M Dareion Kneece IMPRESSIONS  1. Left ventricular ejection fraction, by estimation, is 60 to 65%. The left ventricle has normal function. The left ventricle has no regional wall motion abnormalities. Left ventricular diastolic parameters were normal.  2. Right ventricular systolic function is normal. The right ventricular size is normal. There is normal pulmonary artery systolic pressure.  3. The mitral valve is normal in structure. No evidence of mitral valve regurgitation. No evidence of mitral stenosis.  4. The aortic valve is normal in structure. Aortic valve regurgitation is not visualized. No aortic stenosis is present.  5. The inferior vena cava is normal in size with greater than 50% respiratory variability, suggesting right atrial pressure of 3 mmHg. FINDINGS  Left Ventricle: Left ventricular ejection fraction, by  estimation, is 60 to 65%. The left ventricle has normal function. The left ventricle has no regional wall motion abnormalities. The left ventricular internal cavity size was normal in size. There is  no left ventricular hypertrophy. Left ventricular diastolic parameters were normal. Indeterminate filling pressures. Right Ventricle: The right ventricular size is normal. No increase in right ventricular wall thickness. Right ventricular systolic function is normal. There is normal pulmonary artery systolic pressure. The tricuspid regurgitant velocity is 2.28 m/s, and  with an assumed right atrial pressure of 3 mmHg, the estimated right ventricular systolic pressure is 23.8 mmHg. Left Atrium: Left atrial size was normal in size. Right Atrium: Right atrial size was normal  in size. Pericardium: There is no evidence of pericardial effusion. Mitral Valve: The mitral valve is normal in structure. No evidence of mitral valve regurgitation. No evidence of mitral valve stenosis. Tricuspid Valve: The tricuspid valve is normal in structure. Tricuspid valve regurgitation is trivial. No evidence of tricuspid stenosis. Aortic Valve: The aortic valve is normal in structure. Aortic valve regurgitation is not visualized. No aortic stenosis is present. Pulmonic Valve: The pulmonic valve was normal in structure. Pulmonic valve regurgitation is not visualized. No evidence of pulmonic stenosis. Aorta: The aortic root is normal in size and structure. Venous: The inferior vena cava is normal in size with greater than 50% respiratory variability, suggesting right atrial pressure of 3 mmHg. IAS/Shunts: No atrial level shunt detected by color flow Doppler.  LEFT VENTRICLE PLAX 2D LVIDd:         3.75 cm  Diastology LVIDs:         2.10 cm  LV e' medial:    7.62 cm/s LV PW:         0.84 cm  LV E/e' medial:  10.4 LV IVS:        0.85 cm  LV e' lateral:   9.25 cm/s LVOT diam:     1.70 cm  LV E/e' lateral: 8.6 LVOT Area:     2.27 cm  RIGHT VENTRICLE  RV S prime:     18.10 cm/s TAPSE (M-mode): 1.6 cm LEFT ATRIUM             Index      RIGHT ATRIUM          Index LA diam:        2.20 cm 1.32 cm/m RA Area:     6.82 cm LA Vol (A2C):   15.1 ml 9.03 ml/m RA Volume:   12.00 ml 7.18 ml/m LA Vol (A4C):   11.1 ml 6.64 ml/m LA Biplane Vol: 13.2 ml 7.89 ml/m   AORTA Ao Root diam: 2.40 cm MITRAL VALVE               TRICUSPID VALVE MV Area (PHT): 4.41 cm    TR Peak grad:   20.8 mmHg MV Decel Time: 172 msec    TR Vmax:        228.00 cm/s MV E velocity: 79.30 cm/s MV A velocity: 72.40 cm/s  SHUNTS MV E/A ratio:  1.10        Systemic Diam: 1.70 cm Chilton Si MD Electronically signed by Chilton Si MD Signature Date/Time: 05/03/2020/11:17:02 AM    Final     Anti-infectives: Anti-infectives (From admission, onward)   Start     Dose/Rate Route Frequency Ordered Stop   05/02/20 2200  Vancomycin (VANCOCIN) 1,250 mg in sodium chloride 0.9 % 250 mL IVPB  Status:  Discontinued        1,250 mg 166.7 mL/hr over 90 Minutes Intravenous Every 24 hours 05/02/20 0030 05/02/20 0030   05/02/20 2200  vancomycin (VANCOREADY) IVPB 1250 mg/250 mL        1,250 mg 166.7 mL/hr over 90 Minutes Intravenous Every 24 hours 05/02/20 0031     05/02/20 1000  piperacillin-tazobactam (ZOSYN) IVPB 3.375 g  Status:  Discontinued        3.375 g 12.5 mL/hr over 240 Minutes Intravenous Every 8 hours 05/02/20 0030 05/02/20 0428   05/02/20 0600  ceFEPIme (MAXIPIME) 2 g in sodium chloride 0.9 % 100 mL IVPB  Status:  Discontinued        2 g 200  mL/hr over 30 Minutes Intravenous Every 8 hours 05/02/20 0440 05/04/20 0356   05/01/20 2345  vancomycin (VANCOCIN) IVPB 1000 mg/200 mL premix        1,000 mg 200 mL/hr over 60 Minutes Intravenous  Once 05/01/20 2337 05/02/20 0326   05/01/20 2345  piperacillin-tazobactam (ZOSYN) IVPB 3.375 g        3.375 g 100 mL/hr over 30 Minutes Intravenous  Once 05/01/20 2337 05/02/20 0049       Assessment/Plan L renal lesion Diffuse MP rash   Hypomagnesemia  --per TRH--  Left lateral back necrotic abscess S/p excisional debridement of left lateral back necrotic purulent abscess with scalpel and curette - Dr. Andrey CampanileWilson - 3/28 - POD #2 - Cx - MRSA. Currently on vanc. Okay to narrow to oral abx. WBC 9.0. Recommend total of 10 day course of abx  - Cont twice daily moist-to-dry dressing changes. Patient tolerated dressing change this morning without IV pain medication. I will ask nursing staff to teach patients father how to perform dressing changes as he will be the one helping at home.  - no further surgical debridement necessary at this time.  - Will arrange follow up - our team will sign off. Please call back with any questions or concerns.    LOS: 2 days    Jacinto HalimMichael M Devita Nies , [facility] 05/04/2020, 9:41 AM Please see Amion for pager number during day hours 7:00am-4:30pm

## 2020-05-04 NOTE — Discharge Summary (Signed)
Physician Discharge Summary  Robin Padilla YNW:[id number] DOB: 25-Jun-1970 DOA: 05/01/2020  PCP: Patient, No Pcp Per (Inactive)  Admit date: 05/01/2020   Discharge date: 05/04/2020  Admitted From: Home.  Disposition:  Home.  Recommendations for Outpatient Follow-up:  1. Follow up with PCP in 1-2 weeks. 2. Please obtain BMP/CBC in one week. 3. Advised to follow-up with General surgery in 1-2 weeks.   4. Advised to take Bactrim DS twice a day for 10 days. 5. Advised wet-to-dry dressing daily.  6. Advised to take magnesium oxide 400 mg twice daily for 5 days.    Home Health: None.  Equipment/Devices: None  Discharge Condition: Stable. CODE STATUS:Full code Diet recommendation: Heart Healthy   Brief [facility] Course: This 50 years old female with no PMH presents in the emergency department with back pain/ swelling. Patient has developed itching in the back which she reports, she hasscratched with her fingernail,later has developed burning painandswelling which has increased in size associated with pain. Later she has developed diffuse papular rash. Patient denies any tick bite, recent camping,IV drug abuse, new sexual partners, sick contacts. Patient was admitted for sepsis secondary to possible back abscess,along with necrotic tissue in the back. Patient is started on empiric antibiotics, Patient underwent incision and drainage in the ED by ER physician. General surgery was consulted, Patient underwent debridement with removal of the necrotic tissue.She was found to have leukocytosis and hypomagnesemia,magnesium has been replaced.  Daily wet-to-dry dressings were done. She was on vancomycin since cultures grew MRSA.  Wound was healing well.  Patient is cleared from general surgery to be discharged on oral antibiotic for 10 days.  Patient feels better and want to be discharged.  Patient is being discharged on Bactrim DS twice a day for 10 days.  Will follow up with  general surgery in 1 to 2 weeks.  She was managed for below problems.  Discharge Diagnoses:  Principal Problem:   Sepsis (HCC) Active Problems:   Abscess of back   Lactic acidosis   Hypokalemia, inadequate intake   Lesion of left native kidney  Sepsis secondary to necrotic back abscess: Patient presented with tachycardia, leukocytosis, lactic acidosis, extensive soft tissue infection. Patient was aggressively hydrated with intravenous fluids. Patient was started on empiric antibiotics vancomycin and cefepime. CT chest reveals no evidence of surrounding osteomyelitis or deep space infection. Patient underwent incision and drainage in the ED. Given significant necrotic tissue, general surgery was consulted. Patient underwent debridement with removal of necrotic tissue on 3/28. General surgery recommended continue antibiotics and dry to the dressing daily. MRI: multiple renal cysts.  No abscess. Cultures grew MRSA.  General surgery recommended patient can be discharged on oral antibiotic for 10 days Patient is being discharged on Bactrim DS for 10 days  Lactic acidosis:> Resolved. Lactic acid on Arrival 2.6, resolved wit IV fluids. 1.1  Hypokalemia : > Resolved. Replacement completed.   Hypomagnesemia: Replacement in progress,  recheck labs in the morning. Patient is being discharged on magnesium oxide 400 mg twice daily for 5 days  Discharge Instructions  Discharge Instructions    Call MD for:  difficulty breathing, headache or visual disturbances   Complete by: As directed    Call MD for:  persistant dizziness or light-headedness   Complete by: As directed    Call MD for:  persistant nausea and vomiting   Complete by: As directed    Call MD for:  severe uncontrolled pain   Complete by: As directed  Diet - low sodium heart healthy   Complete by: As directed    Diet Carb Modified   Complete by: As directed    Discharge instructions   Complete by: As directed     Advised to follow-up with primary care physician in 1 week. Advised to follow-up with general surgery in 1 week.   Advised to take Bactrim DS twice a day for 10 days. Advised wet-to-dry dressing daily.  Advised to take magnesium oxide 400 mg twice daily for 5 days.   Increase activity slowly   Complete by: As directed    No wound care   Complete by: As directed      Allergies as of 05/04/2020      Reactions   Chocolate Hives, Shortness Of Breath, Swelling, Rash   Eggs Or Egg-derived Products Other (See Comments)   Unknown, childhood    Orange (diagnostic) Other (See Comments)   Pt does not recall    Cashew Nut (anacardium Occidentale) Skin Test Hives, Swelling, Rash      Medication List    TAKE these medications   acetaminophen 500 MG tablet Commonly known as: TYLENOL Take 1,000 mg by mouth every 6 (six) hours as needed (pain).   magnesium oxide 400 MG tablet Commonly known as: MAG-OX Take 1 tablet (400 mg total) by mouth 2 (two) times daily.   oxyCODONE 5 MG immediate release tablet Commonly known as: Oxy IR/ROXICODONE Take 1 tablet (5 mg total) by mouth every 6 (six) hours as needed for up to 3 days for moderate pain or severe pain (  for moderate pain,  for severe pain).   sulfamethoxazole-trimethoprim 800-160 MG tablet Commonly known as: BACTRIM DS Take 1 tablet by mouth 2 (two) times daily.       Follow-up Information    [facility], [place]. Call.   Specialty: General Surgery Why: Please call to confirm your appointment time. We are working hard to schedule this for you. Please bring a copy of your photo ID and insurance card to the appointment. Please arrive 30 minutes prior to your appointment for paperwork.  Contact information: [address] [place] [address] [phone]             Allergies  Allergen Reactions  . Chocolate Hives, Shortness Of Breath, Swelling and Rash  . Eggs Or Egg-Derived  Products Other (See Comments)    Unknown, childhood   . Orange (Diagnostic) Other (See Comments)    Pt does not recall   . Cashew Nut (Anacardium Occidentale) Skin Test Hives, Swelling and Rash    Consultations:  General surgery   Procedures/Studies: CT Chest W Contrast  Result Date: 05/02/2020 CLINICAL DATA:  Left posterior chest wall mass EXAM: CT CHEST WITH CONTRAST TECHNIQUE: Multidetector CT imaging of the chest was performed during intravenous contrast administration. CONTRAST:  75mL OMNIPAQUE IOHEXOL 300 MG/ML  SOLN COMPARISON:  Abdominal ultrasound April 13, 2003. FINDINGS: Cardiovascular: No significant vascular findings. Normal heart size. No pericardial effusion. Mediastinum/Nodes: No enlarged mediastinal, hilar, or axillary lymph nodes. Thyroid gland, trachea, and esophagus demonstrate no significant findings. Lungs/Pleura: Lungs are clear. No pleural effusion or pneumothorax. Upper Abdomen: Multiple hypodense left upper pole renal lesions some of which are only partially visualized largest of which measures 2.3 cm on image 136/3 with density greater than that which would be expected for simple cyst. Musculoskeletal: Phlegmonous collection with adjacent inflammatory stranding and cutaneous skin thickening in the subcutaneous soft tissues of the left posterior thorax  measuring 5.7 x 2.6 x 7.4 cm on image 34/7 and 87/3. Consistently stool IMPRESSION: 1. Phlegmonous collection with adjacent inflammatory stranding and cutaneous skin thickening in the subcutaneous soft tissues of the left posterior thorax measuring 5.7 x 2.6 x 7.4 cm. Findings are most consistent with a soft tissue phlegmonous collection/developing abscess. 2. Multiple hypodense left upper pole renal lesions some of which are only partially visualized largest of which measures 2.3 cm with density greater than that would be expected for simple cyst. Further evaluation with nonemergent renal protocol abdominal MRI or CT with and  without contrast in is recommended. Electronically Signed   By: Maudry Mayhew MD   On: 05/02/2020 01:05   MR ABDOMEN W WO CONTRAST  Result Date: 05/02/2020 CLINICAL DATA:  Indeterminate left renal lesion on chest CT. Evaluate for renal mass. EXAM: MRI ABDOMEN WITHOUT AND WITH CONTRAST TECHNIQUE: Multiplanar multisequence MR imaging of the abdomen was performed both before and after the administration of intravenous contrast. CONTRAST:  93mL GADAVIST GADOBUTROL 1 MMOL/ML IV SOLN COMPARISON:  Chest CT same date.  Abdominal ultrasound 04/13/2003. FINDINGS: Despite efforts by the technologist and patient, mild motion artifact is present on today's exam and could not be eliminated. This reduces exam sensitivity and specificity. Lower chest:  The visualized lower chest appears unremarkable. Hepatobiliary: The liver appears unremarkable. The gallbladder is mildly distended. No evidence of gallstones, gallbladder wall thickening or biliary dilatation. Pancreas: Unremarkable. No pancreatic ductal dilatation or surrounding inflammatory changes. Spleen: Normal in size without focal abnormality. Adrenals/Urinary Tract: Both adrenal glands appear normal. Renal assessment is limited by the motion artifact. There are multiple cystic appearing left renal cortical and parapelvic lesions. Anteriorly in the upper pole of the left kidney, there is a T1 hyperintense lesion measuring 2.3 cm on image 37/15. There are additional T1 hyperintense lesions in the interpolar region, measuring up to 1.4 cm on image 46/15. No appreciable enhancement of these lesions is seen, although subtracted images were not obtained due to the motion. There are tiny right renal cortical cysts. No evidence of enhancing renal mass or hydronephrosis. Stomach/Bowel: The stomach appears unremarkable for its degree of distension. No evidence of bowel wall thickening, distention or surrounding inflammatory change. Vascular/Lymphatic: There are no enlarged  abdominal lymph nodes. No significant vascular findings. Other: Subcutaneous edema and enhancement posteriorly in the lower left chest wall as seen on earlier chest CT. The abdominal wall appears intact. No ascites. Musculoskeletal: No acute or significant osseous findings. IMPRESSION: 1. 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 No evidence of enhancing renal mass. The examination is limited by motion artifact. 2. Subcutaneous edema and enhancement posteriorly in the lower left chest wall as seen on earlier chest CT. Electronically Signed   By: Carey Bullocks M.D.   On: 05/02/2020 10:47   ECHOCARDIOGRAM COMPLETE  Result Date: 05/03/2020    ECHOCARDIOGRAM REPORT   Patient Name:   AUDRYANNA Tetterton Date of Exam: 05/03/2020 Medical Rec #:  [id number]       Height:       61.0 in Accession #:    [id number]      Weight:       150.1 lb Date of Birth:  [birth year]       BSA:          1.672 m Patient Age:    49 years        BP:           116/70  mmHg Patient Gender: F               HR:           102 bpm. Exam Location:  Inpatient Procedure: 2D Echo Indications:    Endocarditis I38  History:        Patient has no prior history of Echocardiogram examinations.                 Risk Factors:Non-Smoker. Sepsis, Abscess of back, Lactic                 acidosis, no past medical history.  Sonographer:    Jeryl Columbia Referring Phys: [id number] MICHAEL M MACZIS IMPRESSIONS  1. Left ventricular ejection fraction, by estimation, is 60 to 65%. The left ventricle has normal function. The left ventricle has no regional wall motion abnormalities. Left ventricular diastolic parameters were normal.  2. Right ventricular systolic function is normal. The right ventricular size is normal. There is normal pulmonary artery systolic pressure.  3. The mitral valve is normal in structure. No evidence of mitral valve regurgitation. No evidence of mitral stenosis.  4. The aortic valve is normal in  structure. Aortic valve regurgitation is not visualized. No aortic stenosis is present.  5. The inferior vena cava is normal in size with greater than 50% respiratory variability, suggesting right atrial pressure of 3 mmHg. FINDINGS  Left Ventricle: Left ventricular ejection fraction, by estimation, is 60 to 65%. The left ventricle has normal function. The left ventricle has no regional wall motion abnormalities. The left ventricular internal cavity size was normal in size. There is  no left ventricular hypertrophy. Left ventricular diastolic parameters were normal. Indeterminate filling pressures. Right Ventricle: The right ventricular size is normal. No increase in right ventricular wall thickness. Right ventricular systolic function is normal. There is normal pulmonary artery systolic pressure. The tricuspid regurgitant velocity is 2.28 m/s, and  with an assumed right atrial pressure of 3 mmHg, the estimated right ventricular systolic pressure is 23.8 mmHg. Left Atrium: Left atrial size was normal in size. Right Atrium: Right atrial size was normal in size. Pericardium: There is no evidence of pericardial effusion. Mitral Valve: The mitral valve is normal in structure. No evidence of mitral valve regurgitation. No evidence of mitral valve stenosis. Tricuspid Valve: The tricuspid valve is normal in structure. Tricuspid valve regurgitation is trivial. No evidence of tricuspid stenosis. Aortic Valve: The aortic valve is normal in structure. Aortic valve regurgitation is not visualized. No aortic stenosis is present. Pulmonic Valve: The pulmonic valve was normal in structure. Pulmonic valve regurgitation is not visualized. No evidence of pulmonic stenosis. Aorta: The aortic root is normal in size and structure. Venous: The inferior vena cava is normal in size with greater than 50% respiratory variability, suggesting right atrial pressure of 3 mmHg. IAS/Shunts: No atrial level shunt detected by color flow Doppler.   LEFT VENTRICLE PLAX 2D LVIDd:         3.75 cm  Diastology LVIDs:         2.10 cm  LV e' medial:    7.62 cm/s LV PW:         0.84 cm  LV E/e' medial:  10.4 LV IVS:        0.85 cm  LV e' lateral:   9.25 cm/s LVOT diam:     1.70 cm  LV E/e' lateral: 8.6 LVOT Area:     2.27 cm  RIGHT VENTRICLE RV S prime:  18.10 cm/s TAPSE (M-mode): 1.6 cm LEFT ATRIUM             Index      RIGHT ATRIUM          Index LA diam:        2.20 cm 1.32 cm/m RA Area:     6.82 cm LA Vol (A2C):   15.1 ml 9.03 ml/m RA Volume:   12.00 ml 7.18 ml/m LA Vol (A4C):   11.1 ml 6.64 ml/m LA Biplane Vol: 13.2 ml 7.89 ml/m   AORTA Ao Root diam: 2.40 cm MITRAL VALVE               TRICUSPID VALVE MV Area (PHT): 4.41 cm    TR Peak grad:   20.8 mmHg MV Decel Time: 172 msec    TR Vmax:        228.00 cm/s MV E velocity: 79.30 cm/s MV A velocity: 72.40 cm/s  SHUNTS MV E/A ratio:  1.10        Systemic Diam: 1.70 cm Chilton Siiffany Ruthton MD Electronically signed by Chilton Siiffany Damascus MD Signature Date/Time: 05/03/2020/11:17:02 AM    Final     Surgical debridement of back abscess.   Subjective: Patient was seen and examined at bedside.  Overnight events noted.  Patient reports feeling better, Patient was cleared from general surgery to be discharged.  Patient want to be discharged.  Discharge Exam: Vitals:   05/04/20 0607 05/04/20 0954  BP: 105/66 103/61  Pulse: 97 (!) 105  Resp: 18 16  Temp: 98.1 F (36.7 C) 99.1 F (37.3 C)  SpO2: 96% 94%   Vitals:   05/03/20 1902 05/03/20 2118 05/04/20 0607 05/04/20 0954  BP: 120/82 106/65 105/66 103/61  Pulse: (!) 108 98 97 (!) 105  Resp: 18 18 18 16   Temp: 99.7 F (37.6 C) 98.6 F (37 C) 98.1 F (36.7 C) 99.1 F (37.3 C)  TempSrc: Oral Oral  Oral  SpO2: 98% 94% 96% 94%  Weight:      Height:        General: Pt is alert, awake, not in acute distress Cardiovascular: RRR, S1/S2 +, no rubs, no gallops Respiratory: CTA bilaterally, no wheezing, no rhonchi Abdominal: Soft, NT, ND, bowel sounds  + Extremities: no edema, no cyanosis    The results of significant diagnostics from this hospitalization (including imaging, microbiology, ancillary and laboratory) are listed below for reference.     Microbiology: Recent Results (from the past 240 hour(s))  Blood Culture (routine x 2)     Status: None (Preliminary result)   Collection Time: 05/01/20 10:27 PM   Specimen: BLOOD RIGHT ARM  Result Value Ref Range Status   Specimen Description BLOOD RIGHT ARM  Final   Special Requests   Final    BOTTLES DRAWN AEROBIC ONLY Blood Culture results may not be optimal due to an inadequate volume of blood received in culture bottles   Culture   Final    NO GROWTH 2 DAYS Performed at [facility], [address] [place], [place] [address]    Report Status PENDING  Incomplete  Urine culture     Status: Abnormal   Collection Time: 05/01/20 10:55 PM   Specimen: In/Out Cath Urine  Result Value Ref Range Status   Specimen Description IN/OUT CATH URINE  Final   Special Requests   Final    NONE Performed at [facility], [address] [place], [place] [address]    Culture MULTIPLE  SPECIES PRESENT, SUGGEST RECOLLECTION (A)  Final   Report Status 05/03/2020 FINAL  Final  Blood Culture (routine x 2)     Status: None (Preliminary result)   Collection Time: 05/01/20 11:18 PM   Specimen: BLOOD LEFT HAND  Result Value Ref Range Status   Specimen Description BLOOD LEFT HAND  Final   Special Requests   Final    BOTTLES DRAWN AEROBIC AND ANAEROBIC Blood Culture adequate volume   Culture   Final    NO GROWTH 3 DAYS Performed at [facility], [address] [place], [place] [address]    Report Status PENDING  Incomplete  Resp Panel by RT-PCR (Flu A&B, Covid) Nasopharyngeal Swab     Status: None   Collection Time: 05/01/20 11:47 PM   Specimen: Nasopharyngeal Swab; Nasopharyngeal(NP) swabs in vial transport medium  Result Value Ref Range Status   SARS Coronavirus 2 by RT PCR  NEGATIVE NEGATIVE Final    Comment: (NOTE) SARS-CoV-2 target nucleic acids are NOT DETECTED.  The SARS-CoV-2 RNA is generally detectable in upper respiratory specimens during the acute phase of infection. The lowest concentration of SARS-CoV-2 viral copies this assay can detect is 138 copies/mL. A negative result does not preclude SARS-Cov-2 infection and should not be used as the sole basis for treatment or other patient management decisions. A negative result may occur with  improper specimen collection/handling, submission of specimen other than nasopharyngeal swab, presence of viral mutation(s) within the areas targeted by this assay, and inadequate number of viral copies(<138 copies/mL). A negative result must be combined with clinical observations, patient history, and epidemiological information. The expected result is Negative.  Fact Sheet for Patients:  BloggerCourse.com  Fact Sheet for Healthcare Providers:  SeriousBroker.it  This test is no t yet approved or cleared by the [place] FDA and  has been authorized for detection and/or diagnosis of SARS-CoV-2 by FDA under an Emergency Use Authorization (EUA). This EUA will remain  in effect (meaning this test can be used) for the duration of the COVID-19 declaration under Section 564(b)(1) of the Act, 21 U.S.C.section 360bbb-3(b)(1), unless the authorization is terminated  or revoked sooner.       Influenza A by PCR NEGATIVE NEGATIVE Final   Influenza B by PCR NEGATIVE NEGATIVE Final    Comment: (NOTE) The Xpert Xpress SARS-CoV-2/FLU/RSV plus assay is intended as an aid in the diagnosis of influenza from Nasopharyngeal swab specimens and should not be used as a sole basis for treatment. Nasal washings and aspirates are unacceptable for Xpert Xpress SARS-CoV-2/FLU/RSV testing.  Fact Sheet for Patients: BloggerCourse.com  Fact Sheet for  Healthcare Providers: SeriousBroker.it  This test is not yet approved or cleared by the [place] FDA and has been authorized for detection and/or diagnosis of SARS-CoV-2 by FDA under an Emergency Use Authorization (EUA). This EUA will remain in effect (meaning this test can be used) for the duration of the COVID-19 declaration under Section 564(b)(1) of the Act, 21 U.S.C. section 360bbb-3(b)(1), unless the authorization is terminated or revoked.  Performed at [facility], [address], [place], [place] [address]   Aerobic/Anaerobic Culture w Gram Stain (surgical/deep wound)     Status: None (Preliminary result)   Collection Time: 05/02/20  2:29 PM   Specimen: PATH Other; Tissue  Result Value Ref Range Status   Specimen Description ABSCESS LEFT BACK  Final   Special Requests NONE  Final   Gram Stain   Final    ABUNDANT WBC  PRESENT, PREDOMINANTLY PMN ABUNDANT GRAM POSITIVE COCCI IN PAIRS IN CLUSTERS Performed at [facility], [address] [place], [place] [address]    Culture   Final    ABUNDANT METHICILLIN RESISTANT STAPHYLOCOCCUS AUREUS   Report Status PENDING  Incomplete   Organism ID, Bacteria METHICILLIN RESISTANT STAPHYLOCOCCUS AUREUS  Final      Susceptibility   Methicillin resistant staphylococcus aureus - MIC*    CIPROFLOXACIN <=0.5 SENSITIVE Sensitive     ERYTHROMYCIN >=8 RESISTANT Resistant     GENTAMICIN <=0.5 SENSITIVE Sensitive     OXACILLIN >=4 RESISTANT Resistant     TETRACYCLINE <=1 SENSITIVE Sensitive     VANCOMYCIN <=0.5 SENSITIVE Sensitive     TRIMETH/SULFA <=10 SENSITIVE Sensitive     CLINDAMYCIN <=0.25 SENSITIVE Sensitive     RIFAMPIN <=0.5 SENSITIVE Sensitive     Inducible Clindamycin NEGATIVE Sensitive     * ABUNDANT METHICILLIN RESISTANT STAPHYLOCOCCUS AUREUS     Labs: BNP (last 3 results) No results for input(s): BNP in the last 8760 hours. Basic Metabolic Panel: Recent Labs  Lab 05/01/20 1901  05/02/20 0820 05/02/20 1427 05/03/20 0336 05/04/20 0703  NA 138 139 139 140 141  K 3.2* 3.4* 3.1* 3.9 3.7  CL 101 103  --  102 100  CO2 26 29  --  33* 31  GLUCOSE 149* 137*  --  106* 102*  BUN 10 5*  --  5* 6  CREATININE 0.83 0.70  --  0.69 0.66  CALCIUM 8.0* 7.9*  --  7.8* 8.3*  MG  --  0.5*  --  1.3* 1.1*  PHOS  --   --   --  3.7 3.1   Liver Function Tests: Recent Labs  Lab 05/01/20 1901 05/02/20 0820  AST 30 22  ALT 41 30  ALKPHOS 145* 103  BILITOT 0.6 1.0  PROT 6.1* 4.8*  ALBUMIN 2.7* 2.2*   No results for input(s): LIPASE, AMYLASE in the last 168 hours. No results for input(s): AMMONIA in the last 168 hours. CBC: Recent Labs  Lab 05/01/20 1901 05/02/20 0820 05/02/20 1427 05/03/20 0336 05/04/20 0703  WBC 21.0* 22.5*  --  15.5* 9.0  NEUTROABS 16.8* 19.4*  --   --   --   HGB 12.2 10.5* 10.5* 9.1* 9.8*  HCT 37.4 32.9* 31.0* 27.8* 30.4*  MCV 94.4 97.1  --  96.2 96.5  PLT 479* 428*  --  409* 537*   Cardiac Enzymes: No results for input(s): CKTOTAL, CKMB, CKMBINDEX, TROPONINI in the last 168 hours. BNP: Invalid input(s): POCBNP CBG: Recent Labs  Lab 05/02/20 1229  GLUCAP 117*   D-Dimer No results for input(s): DDIMER in the last 72 hours. Hgb A1c Recent Labs    05/02/20 0820  HGBA1C 5.8*   Lipid Profile No results for input(s): CHOL, HDL, LDLCALC, TRIG, CHOLHDL, LDLDIRECT in the last 72 hours. Thyroid function studies No results for input(s): TSH, T4TOTAL, T3FREE, THYROIDAB in the last 72 hours.  Invalid input(s): FREET3 Anemia work up No results for input(s): VITAMINB12, FOLATE, FERRITIN, TIBC, IRON, RETICCTPCT in the last 72 hours. Urinalysis    Component Value Date/Time   COLORURINE YELLOW 05/01/2020 1901   APPEARANCEUR HAZY (A) 05/01/2020 1901   LABSPEC 1.018 05/01/2020 1901   PHURINE 5.0 05/01/2020 1901   GLUCOSEU NEGATIVE 05/01/2020 1901   HGBUR NEGATIVE 05/01/2020 1901   BILIRUBINUR NEGATIVE 05/01/2020 1901   KETONESUR NEGATIVE  05/01/2020 1901   PROTEINUR NEGATIVE 05/01/2020 1901   NITRITE NEGATIVE 05/01/2020 1901  LEUKOCYTESUR MODERATE (A) 05/01/2020 1901   Sepsis Labs Invalid input(s): PROCALCITONIN,  WBC,  LACTICIDVEN Microbiology Recent Results (from the past 240 hour(s))  Blood Culture (routine x 2)     Status: None (Preliminary result)   Collection Time: 05/01/20 10:27 PM   Specimen: BLOOD RIGHT ARM  Result Value Ref Range Status   Specimen Description BLOOD RIGHT ARM  Final   Special Requests   Final    BOTTLES DRAWN AEROBIC ONLY Blood Culture results may not be optimal due to an inadequate volume of blood received in culture bottles   Culture   Final    NO GROWTH 2 DAYS Performed at [facility], [address] [place], [place] [address]    Report Status PENDING  Incomplete  Urine culture     Status: Abnormal   Collection Time: 05/01/20 10:55 PM   Specimen: In/Out Cath Urine  Result Value Ref Range Status   Specimen Description IN/OUT CATH URINE  Final   Special Requests   Final    NONE Performed at [facility], [address] [place], [place] [address]    Culture MULTIPLE SPECIES PRESENT, SUGGEST RECOLLECTION (A)  Final   Report Status 05/03/2020 FINAL  Final  Blood Culture (routine x 2)     Status: None (Preliminary result)   Collection Time: 05/01/20 11:18 PM   Specimen: BLOOD LEFT HAND  Result Value Ref Range Status   Specimen Description BLOOD LEFT HAND  Final   Special Requests   Final    BOTTLES DRAWN AEROBIC AND ANAEROBIC Blood Culture adequate volume   Culture   Final    NO GROWTH 3 DAYS Performed at [facility], [address] [place], [place] [address]    Report Status PENDING  Incomplete  Resp Panel by RT-PCR (Flu A&B, Covid) Nasopharyngeal Swab     Status: None   Collection Time: 05/01/20 11:47 PM   Specimen: Nasopharyngeal Swab; Nasopharyngeal(NP) swabs in vial transport medium  Result Value Ref Range Status   SARS Coronavirus 2 by RT PCR NEGATIVE  NEGATIVE Final    Comment: (NOTE) SARS-CoV-2 target nucleic acids are NOT DETECTED.  The SARS-CoV-2 RNA is generally detectable in upper respiratory specimens during the acute phase of infection. The lowest concentration of SARS-CoV-2 viral copies this assay can detect is 138 copies/mL. A negative result does not preclude SARS-Cov-2 infection and should not be used as the sole basis for treatment or other patient management decisions. A negative result may occur with  improper specimen collection/handling, submission of specimen other than nasopharyngeal swab, presence of viral mutation(s) within the areas targeted by this assay, and inadequate number of viral copies(<138 copies/mL). A negative result must be combined with clinical observations, patient history, and epidemiological information. The expected result is Negative.  Fact Sheet for Patients:  BloggerCourse.com  Fact Sheet for Healthcare Providers:  SeriousBroker.it  This test is no t yet approved or cleared by the [place] FDA and  has been authorized for detection and/or diagnosis of SARS-CoV-2 by FDA under an Emergency Use Authorization (EUA). This EUA will remain  in effect (meaning this test can be used) for the duration of the COVID-19 declaration under Section 564(b)(1) of the Act, 21 U.S.C.section 360bbb-3(b)(1), unless the authorization is terminated  or revoked sooner.       Influenza A by PCR NEGATIVE NEGATIVE Final   Influenza B by PCR NEGATIVE NEGATIVE Final    Comment: (NOTE) The Xpert Xpress SARS-CoV-2/FLU/RSV plus  assay is intended as an aid in the diagnosis of influenza from Nasopharyngeal swab specimens and should not be used as a sole basis for treatment. Nasal washings and aspirates are unacceptable for Xpert Xpress SARS-CoV-2/FLU/RSV testing.  Fact Sheet for Patients: BloggerCourse.com  Fact Sheet for Healthcare  Providers: SeriousBroker.it  This test is not yet approved or cleared by the [place] FDA and has been authorized for detection and/or diagnosis of SARS-CoV-2 by FDA under an Emergency Use Authorization (EUA). This EUA will remain in effect (meaning this test can be used) for the duration of the COVID-19 declaration under Section 564(b)(1) of the Act, 21 U.S.C. section 360bbb-3(b)(1), unless the authorization is terminated or revoked.  Performed at [facility], [address] [place], [place] [address]   Aerobic/Anaerobic Culture w Gram Stain (surgical/deep wound)     Status: None (Preliminary result)   Collection Time: 05/02/20  2:29 PM   Specimen: PATH Other; Tissue  Result Value Ref Range Status   Specimen Description ABSCESS LEFT BACK  Final   Special Requests NONE  Final   Gram Stain   Final    ABUNDANT WBC PRESENT, PREDOMINANTLY PMN ABUNDANT GRAM POSITIVE COCCI IN PAIRS IN CLUSTERS Performed at [facility], [address], [place], [place] [address]    Culture   Final    ABUNDANT METHICILLIN RESISTANT STAPHYLOCOCCUS AUREUS   Report Status PENDING  Incomplete   Organism ID, Bacteria METHICILLIN RESISTANT STAPHYLOCOCCUS AUREUS  Final      Susceptibility   Methicillin resistant staphylococcus aureus - MIC*    CIPROFLOXACIN <=0.5 SENSITIVE Sensitive     ERYTHROMYCIN >=8 RESISTANT Resistant     GENTAMICIN <=0.5 SENSITIVE Sensitive     OXACILLIN >=4 RESISTANT Resistant     TETRACYCLINE <=1 SENSITIVE Sensitive     VANCOMYCIN <=0.5 SENSITIVE Sensitive     TRIMETH/SULFA <=10 SENSITIVE Sensitive     CLINDAMYCIN <=0.25 SENSITIVE Sensitive     RIFAMPIN <=0.5 SENSITIVE Sensitive     Inducible Clindamycin NEGATIVE Sensitive     * ABUNDANT METHICILLIN RESISTANT STAPHYLOCOCCUS AUREUS     Time coordinating discharge: Over 30 minutes  SIGNED:   Cipriano Bunker, MD  [facility] 05/04/2020, 11:32 AM Pager   If 7PM-7AM, please  contact night-coverage [email or web address]

## 2020-05-04 NOTE — Plan of Care (Signed)

## 2020-05-04 NOTE — Discharge Instructions (Signed)
Advised to follow-up with primary care physician in 1 week. Advised to follow-up with general surgery in 1 week.   Advised to take Bactrim DS twice a day for 10 days. Advised wet-to-dry dressing daily.  Advised to take magnesium oxide 400 mg twice daily for 5 days.     Wet to Dry WOUND CARE: - Change dressing twice daily - Supplies: sterile saline, kerlex, scissors, ABD pads, tape  1. Remove dressing and all packing carefully, moistening with sterile saline as needed to avoid packing/internal dressing sticking to the wound. 2.   Clean edges of skin around the wound with water/gauze, making sure there is no tape debris or leakage left on skin that could cause skin irritation or breakdown. 3.   Dampen and clean kerlex with sterile saline and pack wound from wound base to skin level, making sure to take note of any possible areas of wound tracking, tunneling and packing appropriately. Wound can be packed loosely. Trim kerlex to size if a whole kerlex is not required. 4.   Cover wound with a dry ABD pad and secure with tape.  5.   Write the date/time on the dry dressing/tape to better track when the last dressing change occurred. - change dressing as needed if leakage occurs, wound gets contaminated, or patient requests to shower. - You may shower daily with wound open and following the shower the wound should be dried and a clean dressing placed.  - Medical grade tape as well as packing supplies can be found at [employer] on [address] or [employer] on [address]. The remaining supplies can be found at your local drug store, [employer] etc.   Wound Care, Adult Taking care of your wound properly can help to prevent pain, infection, and scarring. It can also help your wound heal more quickly. Follow instructions from your health care provider about how to care for your wound. Supplies needed:  Soap and water.  Wound cleanser.  Gauze.  If needed, a clean bandage  (dressing) or other type of wound dressing material to cover or place in the wound. Follow your health care provider's instructions about what dressing supplies to use.  Cream or ointment to apply to the wound, if told by your health care provider. How to care for your wound Cleaning the wound Ask your health care provider how to clean the wound. This may include:  Using mild soap and water or a wound cleanser.  Using a clean gauze to pat the wound dry after cleaning it. Do not rub or scrub the wound. Dressing care  Wash your hands with soap and water for at least 20 seconds before and after you change the dressing. If soap and water are not available, use hand sanitizer.  Change your dressing as told by your health care provider. This may include: ? Cleaning or rinsing out (irrigating) the wound. ? Placing a dressing over the wound or in the wound (packing). ? Covering the wound with an outer dressing.  Leave any stitches (sutures), skin glue, or adhesive strips in place. These skin closures may need to stay in place for 2 weeks or longer. If adhesive strip edges start to loosen and curl up, you may trim the loose edges. Do not remove adhesive strips completely unless your health care provider tells you to do that.  Ask your health care provider when you can leave the wound uncovered. Checking for infection Check your wound area every day for signs of infection.  Check for:  More redness, swelling, or pain.  Fluid or blood.  Warmth.  Pus or a bad smell.   Follow these instructions at home Medicines  If you were prescribed an antibiotic medicine, cream, or ointment, take or apply it as told by your health care provider. Do not stop using the antibiotic even if your condition improves.  If you were prescribed pain medicine, take it 30 minutes before you do any wound care or as told by your health care provider.  Take over-the-counter and prescription medicines only as told by  your health care provider. Eating and drinking  Eat a diet that includes protein, vitamin A, vitamin C, and other nutrient-rich foods to help the wound heal. ? Foods rich in protein include meat, fish, eggs, dairy, beans, and nuts. ? Foods rich in vitamin A include carrots and dark green, leafy vegetables. ? Foods rich in vitamin C include citrus fruits, tomatoes, broccoli, and peppers.  Drink enough fluid to keep your urine pale yellow. General instructions  Do not take baths, swim, use a hot tub, or do anything that would put the wound underwater until your health care provider approves. Ask your health care provider if you may take showers. You may only be allowed to take sponge baths.  Do not scratch or pick at the wound. Keep it covered as told by your health care provider.  Return to your normal activities as told by your health care provider. Ask your health care provider what activities are safe for you.  Protect your wound from the sun when you are outside for the first 6 months, or for as long as told by your health care provider. Cover up the scar area or apply sunscreen that has an SPF of at least 30.  Do not use any products that contain nicotine or tobacco, such as cigarettes, e-cigarettes, and chewing tobacco. These may delay wound healing. If you need help quitting, ask your health care provider.  Keep all follow-up visits as told by your health care provider. This is important. Contact a health care provider if:  You received a tetanus shot and you have swelling, severe pain, redness, or bleeding at the injection site.  Your pain is not controlled with medicine.  You have any of these signs of infection: ? More redness, swelling, or pain around the wound. ? Fluid or blood coming from the wound. ? Warmth coming from the wound. ? Pus or a bad smell coming from the wound. ? A fever or chills.  You are nauseous or you vomit.  You are dizzy. Get help right away  if:  You have a red streak of skin near the area around your wound.  Your wound has been closed with staples, sutures, skin glue, or adhesive strips and it begins to open up and separate.  Your wound is bleeding, and the bleeding does not stop with gentle pressure.  You have a rash.  You faint.  You have trouble breathing. These symptoms may represent a serious problem that is an emergency. Do not wait to see if the symptoms will go away. Get medical help right away. Call your local emergency services (911 in the [place] Do not drive yourself to the hospital. Summary  Always wash your hands with soap and water for at least 20 seconds before and after changing your dressing.  Change your dressing as told by your health care provider.  To help with healing, eat foods that are rich in protein,  vitamin A, vitamin C, and other nutrients.  Check your wound every day for signs of infection. Contact your health care provider if you suspect that your wound is infected. This information is not intended to replace advice given to you by your health care provider. Make sure you discuss any questions you have with your health care provider. Document Revised: 11/07/2018 Document Reviewed: 11/07/2018 Elsevier Patient Education  2021 [employer].

## 2020-05-04 NOTE — Progress Notes (Signed)
Cross-coverage note:   Patient developed new myoclonus yesterday afternoon. Possibly related to severe hypomagnesemia but that had improved significantly. Discussed with pharmacy. Plan to d/c cefepime and continue vancomycin only for now (gram stain shows abundant staph aureus), follow-up am mag level, and follow cultures.

## 2020-05-04 NOTE — Progress Notes (Signed)
DISCHARGE NOTE HOME Robin Padilla to be discharged Home per MD order. Discussed prescriptions and follow up appointments with the patient. Prescriptions given to patient; medication list explained in detail. Patient verbalized understanding.  Skin clean, dry and intact without evidence of skin break down, no evidence of skin tears noted. IV catheter discontinued intact. Site without signs and symptoms of complications. Dressing and pressure applied. Pt denies pain at the site currently. No complaints noted.  Patient free of lines, drains. Has surgical incision to left upper back, it is clean, dry, intact and covered with WTD dressing.   An After Visit Summary (AVS) was printed and given to the patient. Patient escorted via wheelchair, and discharged home via private auto.  Myrtis Hopping, RN

## 2020-05-06 LAB — CULTURE, BLOOD (ROUTINE X 2)
Culture: NO GROWTH
Special Requests: ADEQUATE

## 2020-05-07 LAB — AEROBIC/ANAEROBIC CULTURE W GRAM STAIN (SURGICAL/DEEP WOUND)

## 2020-05-07 LAB — CULTURE, BLOOD (ROUTINE X 2): Culture: NO GROWTH

## 2020-05-15 NOTE — Progress Notes (Addendum)
Subjective:    Robin Padilla - 50 y.o. female MRN [id number]  Date of birth: 05/19/1970  HPI  Robin Padilla is to establish care and hospital discharge follow-up. Patient has a PMH significant for lesion of left native kidney, sepsis, abscess of back, lactic acidosis, and hypokalemia.   Current issues and/or concerns: Visit 05/01/2020 - 05/04/2020 at the [facility] per MD note: Principal Problem:   Sepsis [facility]) Active Problems:   Abscess of back   Lactic acidosis   Hypokalemia, inadequate intake   Lesion of left native kidney  Sepsis secondary to necrotic back abscess: Patient presented with tachycardia, leukocytosis, lactic acidosis, extensive soft tissue infection. Patient was aggressively hydrated with intravenous fluids. Patient was started on empiric antibiotics vancomycin and cefepime. CT chest reveals no evidence of surrounding osteomyelitis or deep space infection. Patient underwent incision and drainage in the ED. Given significant necrotic tissue, general surgery was consulted. Patient underwent debridement with removal of necrotic tissueon 3/28. General surgery recommended continue antibiotics and dry to the dressing daily. MRI: multiple renal cysts. No abscess. Cultures grew MRSA.  General surgery recommended patient can be discharged on oral antibiotic for 10 days Patient is being discharged on Bactrim DS for 10 days  Lactic acidosis:>Resolved. Lactic acid onArrival 2.6, resolved wit IV fluids. 1.1  Hypokalemia: >Resolved. Replacement completed.  Hypomagnesemia: Replacement in progress,recheck labs in the morning. Patient is being discharged on magnesium oxide 400 mg twice daily for 5 days  Recommendations for Outpatient Follow-up:  1. Follow up with PCP in 1-2 weeks. 2. Please obtain BMP/CBC in one week. 3. Advised to follow-up with General surgery in 1-2 weeks.   4. Advised to take Bactrim DS twice a day for 10 days. 5. Advised  wet-to-dry dressing daily.  6. Advised to take magnesium oxide 400 mg twice daily for 5 days.    05/16/2020: Time since discharge: 12 days Hospital/facility: [facility] Reports she if feeling better since hospital discharge. Back does not hurt and if it does its only once in a while. Father changing dressing twice daily using sodium chloride. Finished Bactrim 2 days ago. Finished Magnesium 3 days ago. Appointment with General Surgery scheduled 05/24/2020.    ROS per HPI   Health Maintenance:  Health Maintenance Due  Topic Date Due  . Hepatitis C Screening  Never done  . PAP SMEAR-Modifier  Never done  . COLONOSCOPY (Pts 45-66yrs Insurance coverage will need to be confirmed)  Never done    Past Medical History: Patient Active Problem List   Diagnosis Date Noted  . Sepsis (HCC) 05/02/2020  . Abscess of back 05/02/2020  . Lactic acidosis 05/02/2020  . Hypokalemia, inadequate intake 05/02/2020  . Lesion of left native kidney 05/02/2020    Social History   reports that she has never smoked. She has never used smokeless tobacco. She reports previous alcohol use. She reports previous drug use.   Family History  family history includes Diabetes in her mother.   Medications: reviewed and updated   Objective:   Physical Exam BP 106/75 (BP Location: Left Arm, Patient Position: Sitting)   Pulse 98   Ht 5' 0.75" (1.543 m)   Wt 124 lb 3.2 oz (56.3 kg)   SpO2 99%   BMI 23.66 kg/m  Physical Exam Constitutional:      Appearance: She is normal weight.  HENT:     Head: Normocephalic and atraumatic.  Eyes:     Extraocular Movements: Extraocular movements intact.     Conjunctiva/sclera:  Conjunctivae normal.     Pupils: Pupils are equal, round, and reactive to light.  Cardiovascular:     Rate and Rhythm: Normal rate and regular rhythm.     Pulses: Normal pulses.     Heart sounds: Normal heart sounds.  Pulmonary:     Effort: Pulmonary effort is normal.     Breath sounds:  Normal breath sounds.  Musculoskeletal:     Cervical back: Normal range of motion and neck supple.  Neurological:     General: No focal deficit present.     Mental Status: She is alert and oriented to person, place, and time.  Psychiatric:        Mood and Affect: Mood normal.        Behavior: Behavior normal.         Assessment & Plan:  1. Encounter to establish care: - Patient presents today to establish care.  - Return for annual physical examination, labs, and health maintenance. Arrive fasting meaning having no for at least 8 hours prior to appointment. You may have only water or black coffee. Please take scheduled medications as normal.  2. Hospital discharge follow-up: Reports she if feeling better since hospital discharge. Back does not hurt and if it does its only once in a while.  3. Sepsis, due to unspecified organism, unspecified whether acute organ dysfunction present [facility]): - BMP to evaluate kidney function and electrolyte balance. - CBC to screen for anemia. - Basic Metabolic Panel - CBC  4. Abscess of back: - Denies pain.  - Continue daily wet-to-dry dressing changes.  - CMA, Margorie John, changed new wet-to-dry dressing with sterile solution during today's visit. Encouraged patient to use sterile solution for at home, patient verbalized understanding.  - Keep appointment scheduled with General Surgery 05/24/2020. - Referral to Wound Care Center for further evaluation and management.  - AMB referral to wound care center  5. Lactic acidosis: - Resolved prior to hospital discharge.  6. Hypokalemia, inadequate intake: - Resolved prior to hospital discharge.  7. Lesion of left native kidney: - Referral to Nephrology for further evaluation and management.  - Ambulatory referral to Nephrology  8. Financial difficulty: - Offered patient [place] financial discount/orange card and blue card application. Counseled patient will need to have an appointment  with the financial counselor for processing of documentation. Patient agreeable.    Patient was given clear instructions to go to Emergency Department or return to medical center if symptoms don't improve, worsen, or new problems develop.The patient verbalized understanding.  I discussed the assessment and treatment plan with the patient. The patient was provided an opportunity to ask questions and all were answered. The patient agreed with the plan and demonstrated an understanding of the instructions.   The patient was advised to call back or seek an in-person evaluation if the symptoms worsen or if the condition fails to improve as anticipated.    Ricky Stabs, NP 05/16/2020, 10:07 AM Primary Care at [facility]

## 2020-05-16 ENCOUNTER — Encounter: Payer: Self-pay | Admitting: Family

## 2020-05-16 ENCOUNTER — Ambulatory Visit (INDEPENDENT_AMBULATORY_CARE_PROVIDER_SITE_OTHER): Payer: Self-pay | Admitting: Family

## 2020-05-16 ENCOUNTER — Other Ambulatory Visit: Payer: Self-pay

## 2020-05-16 VITALS — BP 106/75 | HR 98 | Ht 60.75 in | Wt 124.2 lb

## 2020-05-16 DIAGNOSIS — A419 Sepsis, unspecified organism: Secondary | ICD-10-CM

## 2020-05-16 DIAGNOSIS — E876 Hypokalemia: Secondary | ICD-10-CM

## 2020-05-16 DIAGNOSIS — Z599 Problem related to housing and economic circumstances, unspecified: Secondary | ICD-10-CM

## 2020-05-16 DIAGNOSIS — E872 Acidosis, unspecified: Secondary | ICD-10-CM

## 2020-05-16 DIAGNOSIS — Z7689 Persons encountering health services in other specified circumstances: Secondary | ICD-10-CM

## 2020-05-16 DIAGNOSIS — N289 Disorder of kidney and ureter, unspecified: Secondary | ICD-10-CM

## 2020-05-16 DIAGNOSIS — L02212 Cutaneous abscess of back [any part, except buttock]: Secondary | ICD-10-CM

## 2020-05-16 DIAGNOSIS — Z09 Encounter for follow-up examination after completed treatment for conditions other than malignant neoplasm: Secondary | ICD-10-CM

## 2020-05-16 NOTE — Progress Notes (Signed)
Establish care Hospital f/u

## 2020-05-16 NOTE — Patient Instructions (Addendum)
Return for annual physical examination, labs, and health maintenance. Arrive fasting meaning having no for at least 8 hours prior to appointment. You may have only water or black coffee. Please take scheduled medications as normal.   Keep appointment with General Surgery 05/24/2020.  Referral to Wound Care.  Referral to Nephrologist.  Apply for [facility] Financial Assistance/Orange Card. Schedule appointment with financial counselor Jenene Slicker located at [facility]        [address] [place], [address] Phone: [phone]. Thank you for choosing Primary Care at [facility] for your medical home!    Robin Padilla was seen by Rema Fendt, NP today.   Arnell Sieving primary care provider is Anup Brigham Jodi Geralds, NP.   For the best care possible,  you should try to see Ricky Stabs, NP whenever you come to clinic.   We look forward to seeing you again soon!  If you have any questions about your visit today,  please call us at [phone]  Or feel free to reach your provider via MyChart.    Skin Abscess  A skin abscess is an infected area on or under your skin that contains a collection of pus and other material. An abscess may also be called a furuncle, carbuncle, or boil. An abscess can occur in or on almost any part of your body. Some abscesses break open (rupture) on their own. Most continue to get worse unless they are treated. The infection can spread deeper into the body and eventually into your blood, which can make you feel ill. Treatment usually involves draining the abscess. What are the causes? An abscess occurs when germs, like bacteria, pass through your skin and cause an infection. This may be caused by:  A scrape or cut on your skin.  A puncture wound through your skin, including a needle injection or insect bite.  Blocked oil or sweat glands.  Blocked and infected hair follicles.  A cyst that forms  beneath your skin (sebaceous cyst) and becomes infected. What increases the risk? This condition is more likely to develop in people who:  Have a weak body defense system (immune system).  Have diabetes.  Have dry and irritated skin.  Get frequent injections or use illegal IV drugs.  Have a foreign body in a wound, such as a splinter.  Have problems with their lymph system or veins. What are the signs or symptoms? Symptoms of this condition include:  A painful, firm bump under the skin.  A bump with pus at the top. This may break through the skin and drain. Other symptoms include:  Redness surrounding the abscess site.  Warmth.  Swelling of the lymph nodes (glands) near the abscess.  Tenderness.  A sore on the skin. How is this diagnosed? This condition may be diagnosed based on:  A physical exam.  Your medical history.  A sample of pus. This may be used to find out what is causing the infection.  Blood tests.  Imaging tests, such as an ultrasound, CT scan, or MRI. How is this treated? A small abscess that drains on its own may not need treatment. Treatment for larger abscesses may include:  Moist heat or heat pack applied to the area several times a day.  A procedure to drain the abscess (incision and drainage).  Antibiotic medicines. For a severe abscess, you may first get antibiotics through an IV and then change to antibiotics by mouth. Follow  these instructions at home: Medicines  Take over-the-counter and prescription medicines only as told by your health care provider.  If you were prescribed an antibiotic medicine, take it as told by your health care provider. Do not stop taking the antibiotic even if you start to feel better.   Abscess care  If you have an abscess that has not drained, apply heat to the affected area. Use the heat source that your health care provider recommends, such as a moist heat pack or a heating pad. ? Place a towel between  your skin and the heat source. ? Leave the heat on for 20-30 minutes. ? Remove the heat if your skin turns bright red. This is especially important if you are unable to feel pain, heat, or cold. You may have a greater risk of getting burned.  Follow instructions from your health care provider about how to take care of your abscess. Make sure you: ? Cover the abscess with a bandage (dressing). ? Change your dressing or gauze as told by your health care provider. ? Wash your hands with soap and water before you change the dressing or gauze. If soap and water are not available, use hand sanitizer.  Check your abscess every day for signs of a worsening infection. Check for: ? More redness, swelling, or pain. ? More fluid or blood. ? Warmth. ? More pus or a bad smell.   General instructions  To avoid spreading the infection: ? Do not share personal care items, towels, or hot tubs with others. ? Avoid making skin contact with other people.  Keep all follow-up visits as told by your health care provider. This is important. Contact a health care provider if you have:  More redness, swelling, or pain around your abscess.  More fluid or blood coming from your abscess.  Warm skin around your abscess.  More pus or a bad smell coming from your abscess.  A fever.  Muscle aches.  Chills or a general ill feeling. Get help right away if you:  Have severe pain.  See red streaks on your skin spreading away from the abscess. Summary  A skin abscess is an infected area on or under your skin that contains a collection of pus and other material.  A small abscess that drains on its own may not need treatment.  Treatment for larger abscesses may include having a procedure to drain the abscess and taking an antibiotic. This information is not intended to replace advice given to you by your health care provider. Make sure you discuss any questions you have with your health care  provider. Document Revised: 05/15/2018 Document Reviewed: 03/07/2017 Elsevier Patient Education  2021 [employer].

## 2020-05-17 LAB — BASIC METABOLIC PANEL
BUN/Creatinine Ratio: 19 (ref 9–23)
BUN: 14 mg/dL (ref 6–24)
CO2: 25 mmol/L (ref 20–29)
Calcium: 9.6 mg/dL (ref 8.7–10.2)
Chloride: 97 mmol/L (ref 96–106)
Creatinine, Ser: 0.74 mg/dL (ref 0.57–1.00)
Glucose: 129 mg/dL — ABNORMAL HIGH (ref 65–99)
Potassium: 4.1 mmol/L (ref 3.5–5.2)
Sodium: 137 mmol/L (ref 134–144)
eGFR: 100 mL/min/{1.73_m2} (ref >59)

## 2020-05-17 LAB — CBC
Hematocrit: 37.1 % (ref 34.0–46.6)
Hemoglobin: 12.3 g/dL (ref 11.1–15.9)
MCH: 31.5 pg (ref 26.6–33.0)
MCHC: 33.2 g/dL (ref 31.5–35.7)
MCV: 95 fL (ref 79–97)
Platelets: 335 10*3/uL (ref 150–450)
RBC: 3.9 x10E6/uL (ref 3.77–5.28)
RDW: 13.3 % (ref 11.7–15.4)
WBC: 6.6 10*3/uL (ref 3.4–10.8)

## 2020-05-17 NOTE — Progress Notes (Signed)
Kidney function normal.  ? ?No anemia.

## 2020-06-14 ENCOUNTER — Encounter: Payer: Self-pay | Admitting: Family

## 2020-10-07 ENCOUNTER — Ambulatory Visit
Admission: EM | Admit: 2020-10-07 | Discharge: 2020-10-07 | Disposition: A | Discharge status: Home / Self Care | Payer: Self-pay | Attending: Urgent Care | Admitting: Urgent Care

## 2020-10-07 ENCOUNTER — Other Ambulatory Visit: Payer: Self-pay

## 2020-10-07 DIAGNOSIS — L0291 Cutaneous abscess, unspecified: Secondary | ICD-10-CM

## 2020-10-07 DIAGNOSIS — M79621 Pain in right upper arm: Secondary | ICD-10-CM

## 2020-10-07 MED ORDER — HYDROCODONE-ACETAMINOPHEN 5-325 MG PO TABS: 1.0000 | ORAL_TABLET | Freq: Four times a day (QID) | ORAL | 0 refills | Status: DC | PRN

## 2020-10-07 MED ORDER — NAPROXEN 500 MG PO TABS: 500.0000 mg | ORAL_TABLET | Freq: Two times a day (BID) | ORAL | 0 refills | Status: DC

## 2020-10-07 MED ORDER — DOXYCYCLINE HYCLATE 100 MG PO CAPS: 100.0000 mg | ORAL_CAPSULE | Freq: Two times a day (BID) | ORAL | 0 refills | Status: DC

## 2020-10-07 NOTE — Discharge Instructions (Addendum)
Please start the antibiotic doxycycline to help with your multiple infections.  Make an appointment as soon as possible with [facility] for follow-up to make sure that you do not need surgical debridement.  Please change your dressing 3-5 times daily. Do not apply any ointments or creams. Each time you change your dressing, make sure that you are pressing on the wound to get pus to come out.  Try your best to have a family member help you clean gently around the perimeter of the wound with gentle soap and warm water. Pat your wound dry and let it air out if possible to make sure it is dry before reapplying another dressing. Please schedule naproxen twice daily with food for your severe pain.  If you still have pain despite taking naproxen regularly, this is breakthrough pain.  You can use hydrocodone, a narcotic pain medicine, once every 4-6 hours for this.  Once your pain is better controlled, switch back to just naproxen.

## 2020-10-07 NOTE — ED Provider Notes (Signed)
[facility]   MRN: [id number] DOB: Sep 14, 1970  Subjective:   Robin Padilla is a 50 y.o. female presenting for 4 day history of right axillary pain, swelling, concern for abscess.  Patient has a history of this, last episode was over her back.  She has had some slight drainage but has not been doing any compresses.  She has been using Advil with very short-term relief.  Denies fever, chest pain, difficulty breathing.  She does have significant pain with the use of her right arm due to the abscess of her armpit.  She does have a history of a severe abscess that required surgical debridement  No current facility-administered medications for this encounter.  Current Outpatient Medications:    acetaminophen (TYLENOL) 500 MG tablet, Take 1,000 mg by mouth every 6 (six) hours as needed (pain)., Disp: , Rfl:    Allergies  Allergen Reactions   Chocolate Hives, Shortness Of Breath, Swelling and Rash   Eggs Or Egg-Derived Products Other (See Comments)    Unknown, childhood    Orange (Diagnostic) Other (See Comments)    Pt does not recall    Cashew Nut (Anacardium Occidentale) Skin Test Hives, Swelling and Rash    Past Medical History:  Diagnosis Date   Abscess 05/02/2020   on back   Chronic cough      Past Surgical History:  Procedure Laterality Date   INCISION AND DRAINAGE OF WOUND Left 05/02/2020   Procedure: IRRIGATION AND DEBRIDEMENT BACK  WOUND;  Surgeon: Gaynelle Adu, MD;  [facility];  Service: General;  Laterality: Left;   IRRIGATION AND DEBRIDEMENT BACK  WOUND (Left )  05/02/2020    Family History  Problem Relation Age of Onset   Diabetes Mother     Social History   Tobacco Use   Smoking status: Never   Smokeless tobacco: Never  Vaping Use   Vaping Use: Never used  Substance Use Topics   Alcohol use: Not Currently   Drug use: Not Currently    ROS   Objective:   Vitals: BP (!) 153/91 (BP Location: Left Arm)   Pulse 85   Temp 98.4 F  (36.9 C) (Oral)   Resp 18   SpO2 95%   Physical Exam Constitutional:      General: She is not in acute distress.    Appearance: Normal appearance. She is well-developed. She is not ill-appearing, toxic-appearing or diaphoretic.  HENT:     Head: Normocephalic and atraumatic.     Nose: Nose normal.     Mouth/Throat:     Mouth: Mucous membranes are moist.     Pharynx: Oropharynx is clear.  Eyes:     General: No scleral icterus.    Extraocular Movements: Extraocular movements intact.     Pupils: Pupils are equal, round, and reactive to light.  Cardiovascular:     Rate and Rhythm: Normal rate.  Pulmonary:     Effort: Pulmonary effort is normal.  Skin:    General: Skin is warm and dry.       Neurological:     General: No focal deficit present.     Mental Status: She is alert and oriented to person, place, and time.  Psychiatric:        Mood and Affect: Mood normal.        Behavior: Behavior normal.    PROCEDURE NOTE: I&D of Abscess Verbal consent obtained. Local anesthesia with 3cc of 2% lidocaine with epinephrine. Site cleansed with alcohol swabs. Incision of  1cm was made using an 11 blade, 3cc expressed consisting of a mixture of pus and serosanguinous fluid. Cleansed and dressed.  Assessment and Plan :   PDMP not reviewed this encounter.  1. Abscess of multiple sites   2. Axillary pain, right     Patient was unable to tolerate further drainage of her multiple abscesses.  Recommended starting doxycycline.  Use naproxen for regular pain management, hydrocodone for breakthrough pain.  Follow-up with [facility] is patient may end up needing surgical debridement. Counseled patient on potential for adverse effects with medications prescribed/recommended today, ER and return-to-clinic precautions discussed, patient verbalized understanding.    Wallis Bamberg, PA-C 10/07/20 1606

## 2020-10-07 NOTE — ED Triage Notes (Signed)
3-4 day h/o abscess on her right armpit that has worsened since the onset. Pt notes increased swelling with some drainage. Has been taking advil with temporary relief. Pain with arm ROM. Pt thinks shaving may have caused an ingrown hair.

## 2020-10-17 ENCOUNTER — Other Ambulatory Visit: Payer: Self-pay

## 2020-10-17 ENCOUNTER — Encounter (HOSPITAL_COMMUNITY): Payer: Self-pay | Admitting: *Deleted

## 2020-10-17 ENCOUNTER — Emergency Department (HOSPITAL_COMMUNITY)
Admission: EM | Admit: 2020-10-17 | Discharge: 2020-10-18 | Disposition: A | Discharge status: Home / Self Care | Payer: Self-pay | Attending: Emergency Medicine | Admitting: Emergency Medicine

## 2020-10-17 DIAGNOSIS — Z5321 Procedure and treatment not carried out due to patient leaving prior to being seen by health care provider: Secondary | ICD-10-CM | POA: Insufficient documentation

## 2020-10-17 DIAGNOSIS — L02413 Cutaneous abscess of right upper limb: Secondary | ICD-10-CM | POA: Insufficient documentation

## 2020-10-17 NOTE — ED Triage Notes (Signed)
[facility] for abscess under right arm. Was prescribed antibiotics with minimal relief. No redness noted, no fever.

## 2022-08-26 IMAGING — MR MR ABDOMEN WO/W CM
18 series · 48 of 48 positions shown · IV contrast (Contrast agent)
Comparison: Chest CT same date.  Abdominal ultrasound 04/13/2003.

CLINICAL DATA: Indeterminate left renal lesion on chest CT.
Evaluate for renal mass.

EXAM:
MRI ABDOMEN WITHOUT AND WITH CONTRAST
TECHNIQUE: Multiplanar multisequence MR imaging of the abdomen was performed
both before and after the administration of intravenous contrast.
CONTRAST:  7mL GADAVIST GADOBUTROL 1 MMOL/ML IV SOLN

[Series 5: cor haste · coronal · 6.0mm · 1.19mm/px · 2 of 23 slices shown]
[im 1/23]
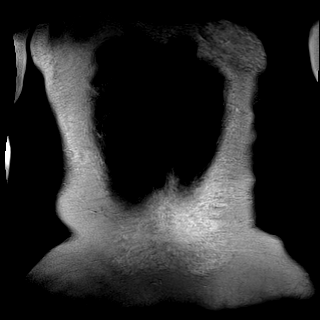
[im 23/23]
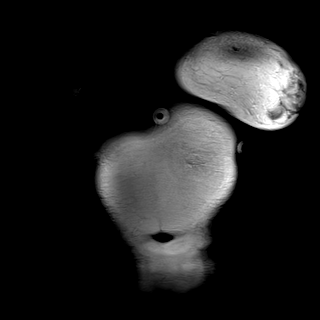

[Series 6: ax haste · axial · 6.0mm · 1.19mm/px · z∈[-221,-13]mm · 2 of 30 slices shown]
[im 1/30]
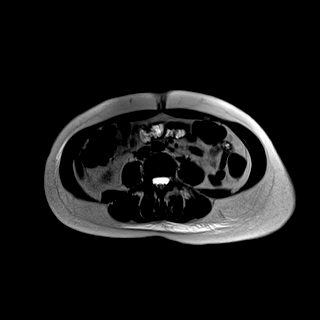
[im 30/30]
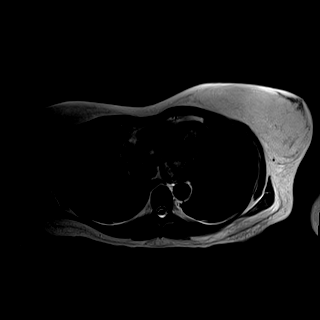

[Series 8: T2 fat-sat · axial · 6.0mm · 1.19mm/px · z∈[-221,-13]mm · 2 of 30 slices shown]
[im 1/30]
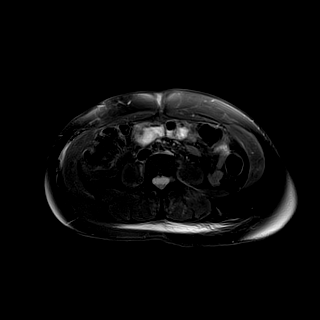
[im 30/30]
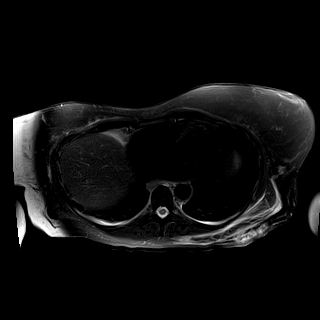

[Series 10: t1_vibe_opp-in_tra_p4_bh · axial · 3.0mm · 1.19mm/px · z∈[-224,-11]mm · 4 of 72 slices shown (1 of 2)]
[im 1/72]
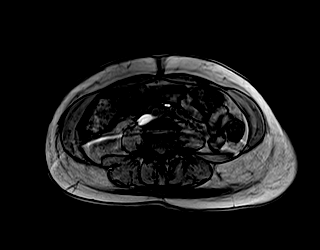
[im 24/72]
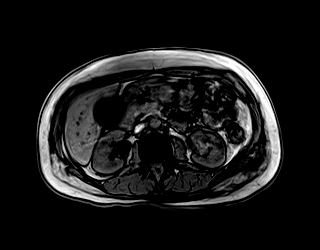
[im 48/72]
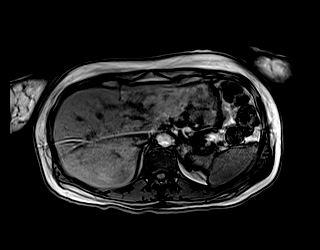
[im 72/72]
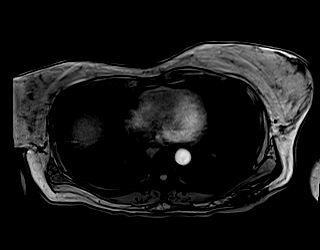

[Series 10: t1_vibe_opp-in_tra_p4_bh · axial · 3.0mm · 1.19mm/px · z∈[-224,-11]mm · 3 of 72 slices shown (2 of 2)]
[im 1/72]
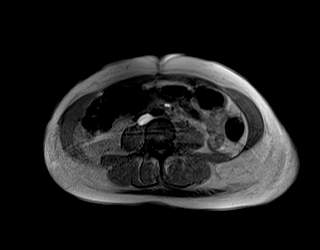
[im 36/72]
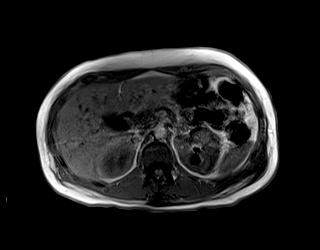
[im 72/72]
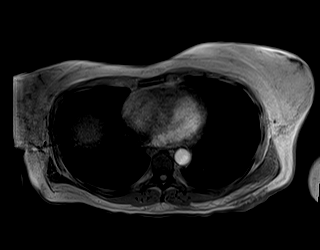

[Series 11: DWI · axial · 6.0mm · 1.42mm/px · z∈[-228,-19]mm · 4 of 90 slices shown (1 of 2)]
[im 1/90]
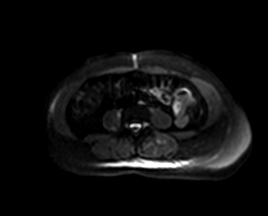
[im 30/90]
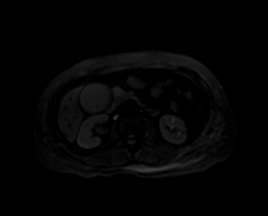
[im 60/90]
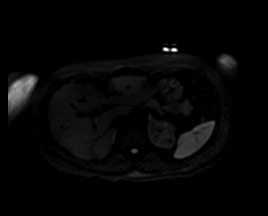
[im 90/90]
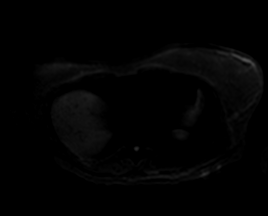

[Series 12: DWI · axial · 6.0mm · 1.42mm/px · 1 of 30 slices shown (2 of 2)]
[im 1/30]
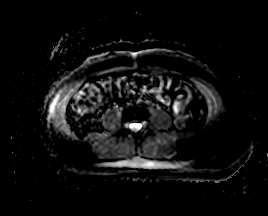

[Series 13: bSSFP · axial · 6.0mm · 0.74mm/px · 1 of 30 slices shown]
[im 1/30]
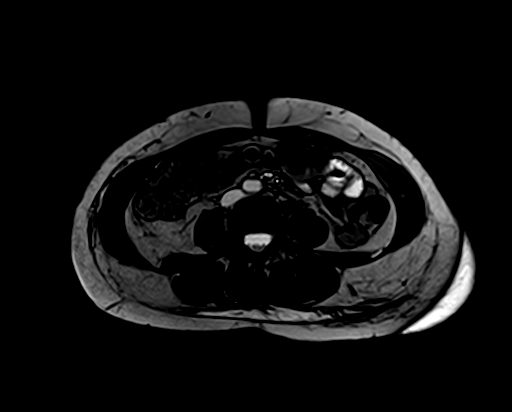

[Series 15: t1_vibe_fs_tra_p4_bh_pre · axial · 3.0mm · 1.19mm/px · z∈[-236,+1]mm · 3 of 80 slices shown]
[im 1/80]
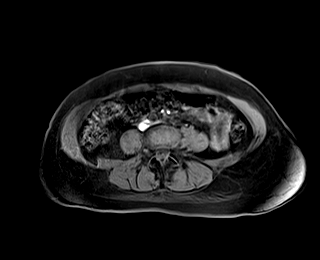
[im 40/80]
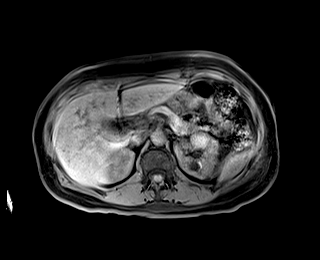
[im 80/80]
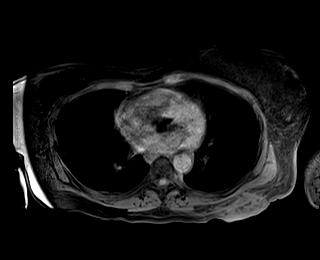

[Series 17: t1_vibe_fs_tra_p4_bh_post · axial · 3.0mm · 1.19mm/px · z∈[-236,+1]mm · 3 of 80 slices shown (1 of 4)]
[im 1/80]
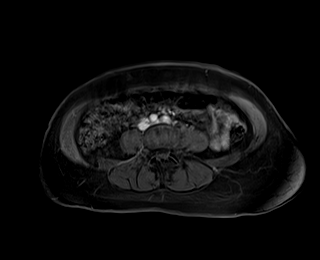
[im 40/80]
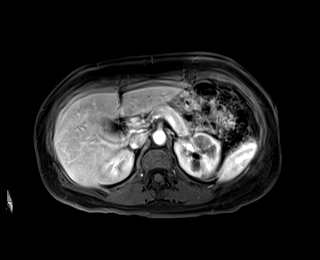
[im 80/80]
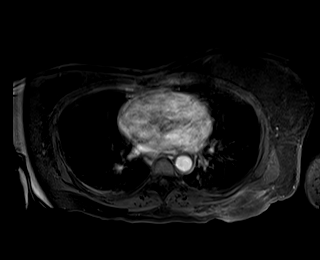

[Series 18: t1_vibe_fs_tra_p4_bh_post · axial · 3.0mm · 1.19mm/px · z∈[-236,+1]mm · 3 of 80 slices shown (2 of 4)]
[im 1/80]
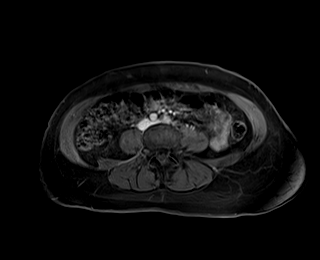
[im 40/80]
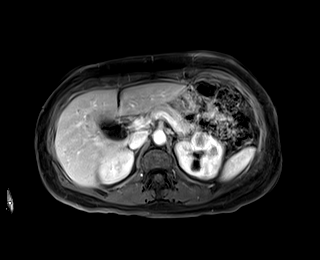
[im 80/80]
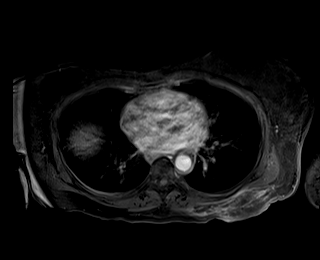

[Series 19: t1_vibe_fs_tra_p4_bh_post · axial · 3.0mm · 1.19mm/px · z∈[-236,+1]mm · 3 of 80 slices shown (3 of 4)]
[im 1/80]
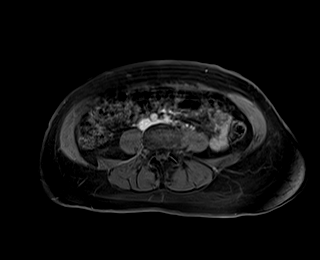
[im 40/80]
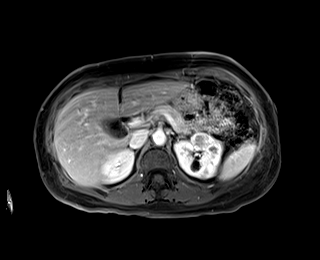
[im 80/80]
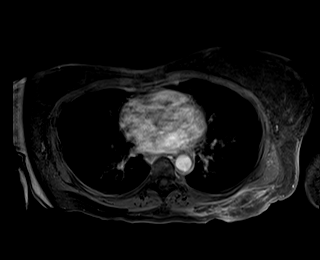

[Series 20: t1_vibe_fs_tra_p4_bh_post · axial · 3.0mm · 1.19mm/px · z∈[-236,+1]mm · 3 of 80 slices shown (4 of 4)]
[im 1/80]
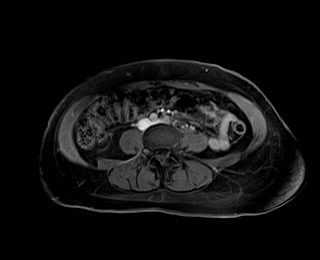
[im 40/80]
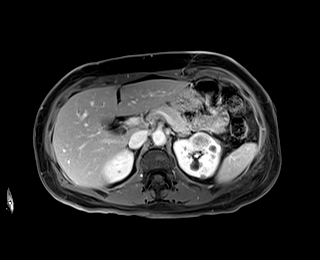
[im 80/80]
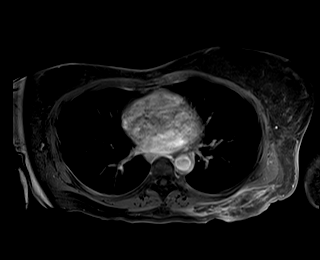

[Series 21: T1 dynamic post-contrast · coronal · 3.0mm · 1.31mm/px · 2 of 60 slices shown]
[im 1/60]
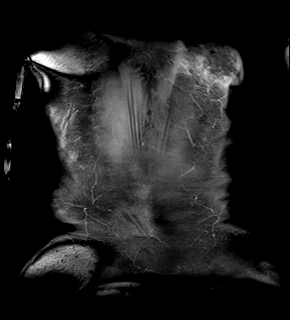
[im 60/60]
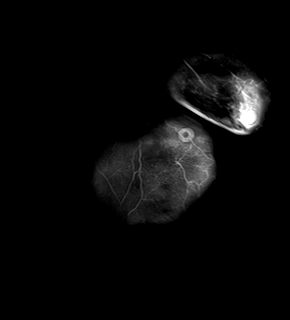

[Series 1007: results sub_p1_t1_vibe_fs_tra_p4_bh_post · axial · 3.0mm · 1.19mm/px · z∈[-236,+1]mm · 3 of 80 slices shown (1 of 4)]
[im 1/80]
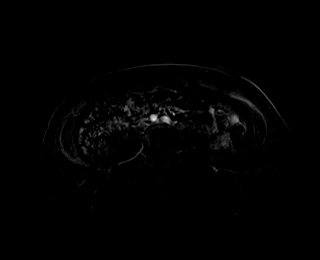
[im 40/80]
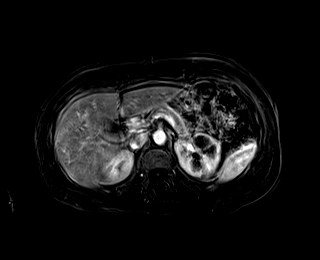
[im 80/80]
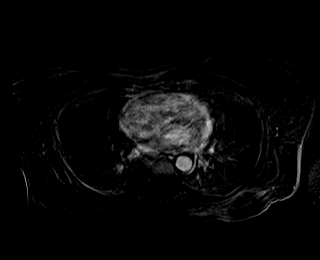

[Series 1009: results sub_p1_t1_vibe_fs_tra_p4_bh_post · axial · 3.0mm · 1.19mm/px · z∈[-236,+1]mm · 3 of 80 slices shown (2 of 4)]
[im 1/80]
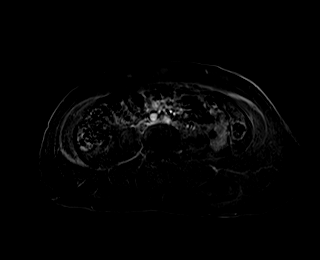
[im 40/80]
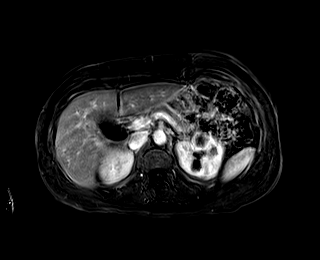
[im 80/80]
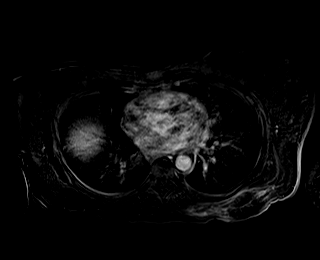

[Series 1011: results sub_p1_t1_vibe_fs_tra_p4_bh_post · axial · 3.0mm · 1.19mm/px · z∈[-236,+1]mm · 3 of 80 slices shown (3 of 4)]
[im 1/80]
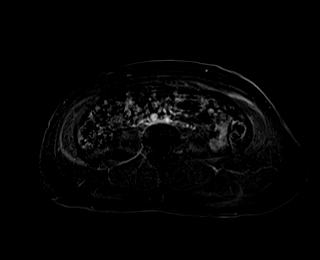
[im 40/80]
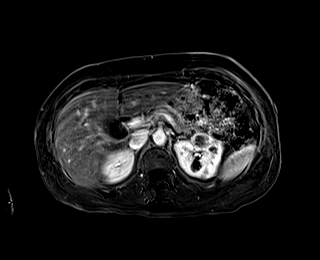
[im 80/80]
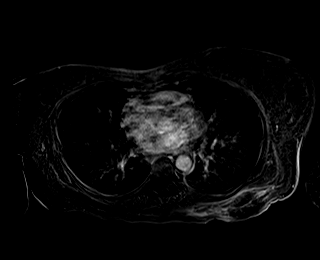

[Series 1013: results sub_p1_t1_vibe_fs_tra_p4_bh_post · axial · 3.0mm · 1.19mm/px · z∈[-236,+1]mm · 3 of 80 slices shown (4 of 4)]
[im 1/80]
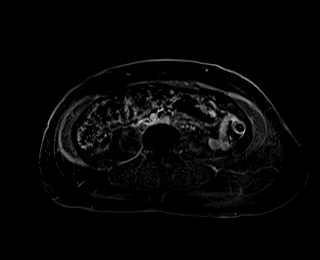
[im 40/80]
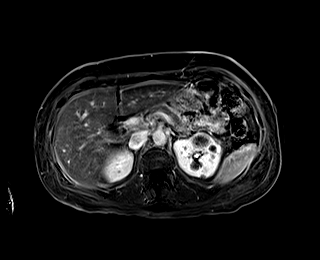
[im 80/80]
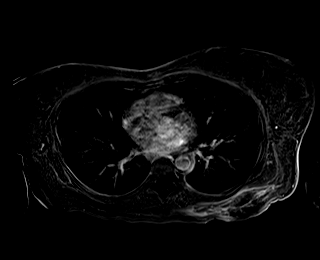

[48 of 48 positions shown; findings below may reference images not displayed]

FINDINGS: Despite efforts by the technologist and patient, mild motion
artifact is present on today's exam and could not be eliminated.
This reduces exam sensitivity and specificity.

Lower chest:  The visualized lower chest appears unremarkable.

Hepatobiliary: The liver appears unremarkable. The gallbladder is
mildly distended. No evidence of gallstones, gallbladder wall
thickening or biliary dilatation.

Pancreas: Unremarkable. No pancreatic ductal dilatation or
surrounding inflammatory changes.

Spleen: Normal in size without focal abnormality.

Adrenals/Urinary Tract: Both adrenal glands appear normal. Renal
assessment is limited by the motion artifact. There are multiple
cystic appearing left renal cortical and parapelvic lesions.
Anteriorly in the upper pole of the left kidney, there is a T1
hyperintense lesion measuring 2.3 cm on image 37/15. There are
additional T1 hyperintense lesions in the interpolar region,
measuring up to 1.4 cm on image 46/15. No appreciable enhancement of
these lesions is seen, although subtracted images were not obtained
due to the motion. There are tiny right renal cortical cysts. No
evidence of enhancing renal mass or hydronephrosis.

Stomach/Bowel: The stomach appears unremarkable for its degree of
distension. No evidence of bowel wall thickening, distention or
surrounding inflammatory change.

Vascular/Lymphatic: There are no enlarged abdominal lymph nodes. No
significant vascular findings.

Other: Subcutaneous edema and enhancement posteriorly in the lower
left chest wall as seen on earlier chest CT. The abdominal wall
appears intact. No ascites.

Musculoskeletal: No acute or significant osseous findings.
IMPRESSION: 1. Multiple cystic appearing left renal lesions consistent with
Bosniak 1 and 2 renal cysts. No evidence of enhancing renal mass.
The examination is limited by motion artifact.
2. Subcutaneous edema and enhancement posteriorly in the lower left
chest wall as seen on earlier chest CT.

## 2023-01-02 ENCOUNTER — Ambulatory Visit (HOSPITAL_COMMUNITY)
Admission: EM | Admit: 2023-01-02 | Discharge: 2023-01-02 | Disposition: A | Discharge status: Home / Self Care | Payer: Self-pay | Attending: Nurse Practitioner | Admitting: Nurse Practitioner

## 2023-01-02 ENCOUNTER — Encounter (HOSPITAL_COMMUNITY): Payer: Self-pay

## 2023-01-02 DIAGNOSIS — L0231 Cutaneous abscess of buttock: Secondary | ICD-10-CM

## 2023-01-02 MED ORDER — DOXYCYCLINE HYCLATE 100 MG PO CAPS: 100.0000 mg | ORAL_CAPSULE | Freq: Two times a day (BID) | ORAL | 0 refills | Status: AC

## 2023-01-02 NOTE — Discharge Instructions (Signed)
Continue cleaning the abscesses twice daily with mild soap and water.  Keep covered with a gauze and tape until they stop draining.  Start taking the doxycycline and take as prescribed to treat the rest of the infection.  Seek care if symptoms do not improve with treatment.

## 2023-01-02 NOTE — ED Provider Notes (Addendum)
[facility]    CSN: [id number] Arrival date & time: 01/02/23  1503      History   Chief Complaint Chief Complaint  Patient presents with   Abscess    HPI Robin Padilla is a 52 y.o. female.   Patient presents today with an abscess to her left buttocks that has been present for the past 2 to 3 weeks.  She reports it is draining and oozing blood and will not heal up.  Reports she has history of abscess in a different area and it got better on its own after couple of days.  Denies any current fevers or nausea/vomiting.  Has been cleaning the abscess with mild soap and water.  Patient reports does not recall last menstrual cycle.  Reports her menstrual periods are irregular.  She is competent she is not pregnant as she is not sexually active.    Past Medical History:  Diagnosis Date   Abscess 05/02/2020   on back   Chronic cough     Patient Active Problem List   Diagnosis Date Noted   Sepsis (HCC) 05/02/2020   Abscess of back 05/02/2020   Lactic acidosis 05/02/2020   Hypokalemia, inadequate intake 05/02/2020   Lesion of left native kidney 05/02/2020    Past Surgical History:  Procedure Laterality Date   INCISION AND DRAINAGE OF WOUND Left 05/02/2020   Procedure: IRRIGATION AND DEBRIDEMENT BACK  WOUND;  Surgeon: Gaynelle Adu, MD;  Location: [facility] OR;  Service: General;  Laterality: Left;   IRRIGATION AND DEBRIDEMENT BACK  WOUND (Left )  05/02/2020    OB History   No obstetric history on file.      Home Medications    Prior to Admission medications   Medication Sig Start Date End Date Taking? Authorizing Provider  acetaminophen (TYLENOL) 500 MG tablet Take 1,000 mg by mouth every 6 (six) hours as needed (pain).    [provider]  doxycycline (VIBRAMYCIN) 100 MG capsule Take 1 capsule (100 mg total) by mouth 2 (two) times daily for 7 days. 01/02/23 01/09/23  Valentino Nose, NP    Family History Family History  Problem Relation Age of  Onset   Diabetes Mother     Social History Social History   Tobacco Use   Smoking status: Never   Smokeless tobacco: Never  Vaping Use   Vaping status: Never Used  Substance Use Topics   Alcohol use: Not Currently   Drug use: Not Currently     Allergies   Chocolate, Egg-derived products, Orange (diagnostic), and Cashew nut (anacardium occidentale) skin test   Review of Systems Review of Systems Per HPI  Physical Exam Triage Vital Signs ED Triage Vitals [01/02/23 1525]  Encounter Vitals Group     BP (!) 163/88     Systolic BP Percentile      Diastolic BP Percentile      Pulse Rate (!) 106     Resp 18     Temp 98.3 F (36.8 C)     Temp Source Oral     SpO2 95 %     Weight      Height      Head Circumference      Peak Flow      Pain Score      Pain Loc      Pain Education      Exclude from Growth Chart    No data found.  Updated Vital Signs BP (!) 163/88 (BP Location: Left  Arm)   Pulse (!) 106   Temp 98.3 F (36.8 C) (Oral)   Resp 18   SpO2 95%   Visual Acuity Right Eye Distance:   Left Eye Distance:   Bilateral Distance:    Right Eye Near:   Left Eye Near:    Bilateral Near:     Physical Exam Vitals and nursing note reviewed.  Constitutional:      General: She is not in acute distress.    Appearance: Normal appearance. She is not toxic-appearing.  HENT:     Mouth/Throat:     Mouth: Mucous membranes are moist.     Pharynx: Oropharynx is clear.  Pulmonary:     Effort: Pulmonary effort is normal. No respiratory distress.  Skin:    General: Skin is warm and dry.     Capillary Refill: Capillary refill takes less than 2 seconds.     Findings: Abscess present.          Comments: Draining abscess noted to left buttocks and approximately area marked.  Area is approximately 3 cm x 5 cm.  There are 2 areas that are draining blood and pus.  Area is tender to touch, however no warmth.  Neurological:     Mental Status: She is alert and oriented to  person, place, and time.  Psychiatric:        Behavior: Behavior is cooperative.      UC Treatments / Results  Labs (all labs ordered are listed, but only abnormal results are displayed) Labs Reviewed - No data to display  EKG   Radiology No results found.  Procedures Procedures (including critical care time)  Medications Ordered in UC Medications - No data to display  Initial Impression / Assessment and Plan / UC Course  I have reviewed the triage vital signs and the nursing notes.  Pertinent labs & imaging results that were available during my care of the patient were reviewed by me and considered in my medical decision making (see chart for details).   In triage, patient is mildly hypertensive and tachycardic.  She is not tachypneic, afebrile, and is oxygenating well on room air.  1. Left buttock abscess I was able to express a moderate amount of purulent drainage from the abscess today without making an incision and patient tolerated well Continue good wound care, recommend start doxycycline to help resolve abscess Seek care if symptoms do not improve with treatment  The patient was given the opportunity to ask questions.  All questions answered to their satisfaction.  The patient is in agreement to this plan.   Final Clinical Impressions(s) / UC Diagnoses   Final diagnoses:  Left buttock abscess     Discharge Instructions      Continue cleaning the abscesses twice daily with mild soap and water.  Keep covered with a gauze and tape until they stop draining.  Start taking the doxycycline and take as prescribed to treat the rest of the infection.  Seek care if symptoms do not improve with treatment.    ED Prescriptions     Medication Sig Dispense Auth. Provider   doxycycline (VIBRAMYCIN) 100 MG capsule Take 1 capsule (100 mg total) by mouth 2 (two) times daily for 7 days. 14 capsule Valentino Nose, NP      PDMP not reviewed this encounter.    Valentino Nose, NP 01/02/23 1552    Valentino Nose, NP 01/02/23 1555

## 2023-01-02 NOTE — ED Triage Notes (Signed)
Pt presents to the office for abscess on the buttocks x 3 days. Pt reports the abscess is draining and has an odor.
